# Patient Record
Sex: Male | Born: 1955 | Race: White | Hispanic: No | Marital: Married | Smoking: Never smoker
Health system: Southern US, Community
[De-identification: ages and names within clinical notes are randomized; demographics above are authoritative.]

## PROBLEM LIST (undated history)

## (undated) DIAGNOSIS — E785 Hyperlipidemia, unspecified: Secondary | ICD-10-CM

## (undated) DIAGNOSIS — Z951 Presence of aortocoronary bypass graft: Secondary | ICD-10-CM

## (undated) DIAGNOSIS — I712 Thoracic aortic aneurysm, without rupture: Secondary | ICD-10-CM

## (undated) DIAGNOSIS — I25119 Atherosclerotic heart disease of native coronary artery with unspecified angina pectoris: Secondary | ICD-10-CM

## (undated) DIAGNOSIS — I4891 Unspecified atrial fibrillation: Secondary | ICD-10-CM

## (undated) DIAGNOSIS — I9789 Other postprocedural complications and disorders of the circulatory system, not elsewhere classified: Secondary | ICD-10-CM

## (undated) HISTORY — DX: Hyperlipidemia, unspecified: E78.5

## (undated) HISTORY — DX: Presence of aortocoronary bypass graft: Z95.1

## (undated) HISTORY — DX: Unspecified atrial fibrillation: I48.91

## (undated) HISTORY — DX: Thoracic aortic aneurysm, without rupture: I71.2

## (undated) HISTORY — DX: Atherosclerotic heart disease of native coronary artery with unspecified angina pectoris: I25.119

## (undated) HISTORY — DX: Other postprocedural complications and disorders of the circulatory system, not elsewhere classified: I97.89

---

## 2012-01-17 HISTORY — PX: CORONARY ARTERY BYPASS GRAFT: SHX141

## 2012-07-16 DIAGNOSIS — I712 Thoracic aortic aneurysm, without rupture: Secondary | ICD-10-CM

## 2012-07-16 DIAGNOSIS — I7121 Aneurysm of the ascending aorta, without rupture: Secondary | ICD-10-CM

## 2012-07-16 HISTORY — DX: Aneurysm of the ascending aorta, without rupture: I71.21

## 2012-07-16 HISTORY — PX: CARDIAC CATHETERIZATION: SHX172

## 2012-07-16 HISTORY — DX: Thoracic aortic aneurysm, without rupture: I71.2

## 2012-07-16 HISTORY — PX: TRANSTHORACIC ECHOCARDIOGRAM: SHX275

## 2012-07-16 HISTORY — PX: NM MYOVIEW LTD: HXRAD82

## 2012-07-19 DIAGNOSIS — I25119 Atherosclerotic heart disease of native coronary artery with unspecified angina pectoris: Secondary | ICD-10-CM

## 2012-07-19 HISTORY — DX: Atherosclerotic heart disease of native coronary artery with unspecified angina pectoris: I25.119

## 2012-07-29 DIAGNOSIS — I2510 Atherosclerotic heart disease of native coronary artery without angina pectoris: Secondary | ICD-10-CM | POA: Insufficient documentation

## 2012-07-29 DIAGNOSIS — I25118 Atherosclerotic heart disease of native coronary artery with other forms of angina pectoris: Secondary | ICD-10-CM | POA: Insufficient documentation

## 2012-08-16 DIAGNOSIS — R002 Palpitations: Secondary | ICD-10-CM | POA: Insufficient documentation

## 2012-08-16 DIAGNOSIS — I4891 Unspecified atrial fibrillation: Secondary | ICD-10-CM

## 2012-08-16 HISTORY — DX: Unspecified atrial fibrillation: I48.91

## 2012-08-22 DIAGNOSIS — Z951 Presence of aortocoronary bypass graft: Secondary | ICD-10-CM

## 2012-08-22 HISTORY — DX: Presence of aortocoronary bypass graft: Z95.1

## 2014-11-02 ENCOUNTER — Observation Stay (HOSPITAL_COMMUNITY)
Admission: EM | Admit: 2014-11-02 | Discharge: 2014-11-04 | Disposition: A | Discharge status: Home / Self Care | Payer: BC Managed Care – PPO | Attending: Internal Medicine | Admitting: Internal Medicine

## 2014-11-02 ENCOUNTER — Encounter (HOSPITAL_COMMUNITY): Payer: Self-pay | Admitting: Emergency Medicine

## 2014-11-02 ENCOUNTER — Emergency Department (HOSPITAL_COMMUNITY): Payer: BC Managed Care – PPO

## 2014-11-02 DIAGNOSIS — Z951 Presence of aortocoronary bypass graft: Secondary | ICD-10-CM | POA: Insufficient documentation

## 2014-11-02 DIAGNOSIS — I2510 Atherosclerotic heart disease of native coronary artery without angina pectoris: Secondary | ICD-10-CM | POA: Insufficient documentation

## 2014-11-02 DIAGNOSIS — R7989 Other specified abnormal findings of blood chemistry: Secondary | ICD-10-CM | POA: Insufficient documentation

## 2014-11-02 DIAGNOSIS — K8020 Calculus of gallbladder without cholecystitis without obstruction: Secondary | ICD-10-CM | POA: Insufficient documentation

## 2014-11-02 DIAGNOSIS — I25110 Atherosclerotic heart disease of native coronary artery with unstable angina pectoris: Principal | ICD-10-CM | POA: Insufficient documentation

## 2014-11-02 DIAGNOSIS — Z87891 Personal history of nicotine dependence: Secondary | ICD-10-CM | POA: Diagnosis not present

## 2014-11-02 DIAGNOSIS — K810 Acute cholecystitis: Secondary | ICD-10-CM

## 2014-11-02 DIAGNOSIS — R945 Abnormal results of liver function studies: Secondary | ICD-10-CM

## 2014-11-02 DIAGNOSIS — Z7982 Long term (current) use of aspirin: Secondary | ICD-10-CM | POA: Diagnosis not present

## 2014-11-02 DIAGNOSIS — D696 Thrombocytopenia, unspecified: Secondary | ICD-10-CM | POA: Diagnosis not present

## 2014-11-02 DIAGNOSIS — E785 Hyperlipidemia, unspecified: Secondary | ICD-10-CM | POA: Insufficient documentation

## 2014-11-02 DIAGNOSIS — R1013 Epigastric pain: Secondary | ICD-10-CM

## 2014-11-02 DIAGNOSIS — I25118 Atherosclerotic heart disease of native coronary artery with other forms of angina pectoris: Secondary | ICD-10-CM | POA: Insufficient documentation

## 2014-11-02 DIAGNOSIS — R079 Chest pain, unspecified: Secondary | ICD-10-CM

## 2014-11-02 DIAGNOSIS — I200 Unstable angina: Secondary | ICD-10-CM | POA: Diagnosis present

## 2014-11-02 LAB — CBC
HCT: 38 % — ABNORMAL LOW (ref 39.0–52.0)
Hemoglobin: 13.4 g/dL (ref 13.0–17.0)
MCH: 32.3 pg (ref 26.0–34.0)
MCHC: 35.3 g/dL (ref 30.0–36.0)
MCV: 91.6 fL (ref 78.0–100.0)
PLATELETS: 122 10*3/uL — AB (ref 150–400)
RBC: 4.15 MIL/uL — ABNORMAL LOW (ref 4.22–5.81)
RDW: 12.7 % (ref 11.5–15.5)
WBC: 9 10*3/uL (ref 4.0–10.5)

## 2014-11-02 LAB — COMPREHENSIVE METABOLIC PANEL
ALBUMIN: 3.8 g/dL (ref 3.5–5.0)
ALK PHOS: 54 U/L (ref 38–126)
ALT: 159 U/L — ABNORMAL HIGH (ref 17–63)
ANION GAP: 6 (ref 5–15)
AST: 203 U/L — ABNORMAL HIGH (ref 15–41)
BUN: 16 mg/dL (ref 6–20)
CALCIUM: 8.8 mg/dL — AB (ref 8.9–10.3)
CHLORIDE: 106 mmol/L (ref 101–111)
CO2: 25 mmol/L (ref 22–32)
Creatinine, Ser: 1.12 mg/dL (ref 0.61–1.24)
GFR calc non Af Amer: >60 mL/min (ref >60)
Glucose, Bld: 129 mg/dL — ABNORMAL HIGH (ref 65–99)
POTASSIUM: 3.8 mmol/L (ref 3.5–5.1)
SODIUM: 137 mmol/L (ref 135–145)
Total Bilirubin: 1.4 mg/dL — ABNORMAL HIGH (ref 0.3–1.2)
Total Protein: 6.4 g/dL — ABNORMAL LOW (ref 6.5–8.1)

## 2014-11-02 LAB — I-STAT TROPONIN, ED: TROPONIN I, POC: 0 ng/mL (ref 0.00–0.08)

## 2014-11-02 LAB — LIPASE, BLOOD: Lipase: 28 U/L (ref 22–51)

## 2014-11-02 MED ORDER — FENTANYL CITRATE (PF) 100 MCG/2ML IJ SOLN
50.0000 ug | Freq: Once | INTRAMUSCULAR | Status: DC
  Filled 2014-11-02: qty 2

## 2014-11-02 NOTE — ED Notes (Signed)
Per EMS, an hour ago, patient had pain inmiddle top of stomach and lower part of chest, hx of triple bypas 2 years ago. Patient had increased respirations, pale and clammy on arrival. 20g in right ac. Pain 5/10. 324mg  of aspirin and 2 nitro tab given prior to ems arrival. 1st nitro tab helped, 2nd did not. VS with ems 134/60, p 64, 99% on 2L nasal cannula.

## 2014-11-02 NOTE — ED Provider Notes (Signed)
CSN: [id number]     Arrival date & time 11/02/14  2012 History   First MD Initiated Contact with Patient 11/02/14 2018     Chief Complaint  Patient presents with  . Chest Pain  . Shortness of Breath     (Consider location/radiation/quality/duration/timing/severity/associated sxs/prior Treatment) Patient is a 59 y.o. male presenting with chest pain and shortness of breath.  Chest Pain Pain location:  Substernal area Pain quality: sharp   Pain radiates to:  Does not radiate Pain radiates to the back: no   Pain severity:  Severe Onset quality:  Sudden Duration:  3 hours Timing:  Constant Progression:  Partially resolved Chronicity:  New Context comment:  At rest, sitting Relieved by:  Nothing Worsened by:  Nothing tried Associated symptoms: shortness of breath   Associated symptoms: no nausea and not vomiting   Shortness of Breath Associated symptoms: chest pain   Associated symptoms: no vomiting    [facility] Past Medical History  Diagnosis Date  . Hyperlipidemia    Past Surgical History  Procedure Laterality Date  . Other surgical history      triple bypass surgery   History reviewed. No pertinent family history. Social History  Substance Use Topics  . Smoking status: Former Games developermoker  . Smokeless tobacco: None  . Alcohol Use: 0.6 oz/week    1 Cans of beer per week     Comment: occasional    Review of Systems  Respiratory: Positive for shortness of breath.   Cardiovascular: Positive for chest pain.  Gastrointestinal: Negative for nausea and vomiting.  All other systems reviewed and are negative.     Allergies  Review of patient's allergies indicates no known allergies.  Home Medications   Prior to Admission medications   Medication Sig Start Date End Date Taking? Authorizing Provider  aspirin EC 81 MG tablet Take 81 mg by mouth daily.   Yes Historical Provider, MD  atorvastatin (LIPITOR) 80 MG tablet Take 80  mg by mouth daily. 10/21/14  Yes Historical Provider, MD  metoprolol tartrate (LOPRESSOR) 25 MG tablet Take 37.5 mg by mouth 2 (two) times daily. 10/04/14  Yes Historical Provider, MD  nitroGLYCERIN (NITROSTAT) 0.4 MG SL tablet PLACE 1 TABLET UNDER TONGUE EVERY 5 MINS AS NEEDED FOR CHEST PAIN CALL 911/EMS BY 3RD DOSE 10/26/14  Yes Historical Provider, MD  temazepam (RESTORIL) 15 MG capsule Take 15 mg by mouth at bedtime as needed. 08/24/14  Yes Historical Provider, MD   BP 111/60 mmHg  Pulse 76  Temp(Src) 97.5 F (36.4 C) (Oral)  Resp 11  Ht 5\' 7"  (1.702 m)  Wt 210 lb (95.255 kg)  BMI 32.88 kg/m2  SpO2 96% Physical Exam  Constitutional: He is oriented to person, place, and time. He appears well-developed and well-nourished.  HENT:  Head: Normocephalic and atraumatic.  Eyes: Conjunctivae and EOM are normal.  Neck: Normal range of motion. Neck supple.  Cardiovascular: Normal rate, regular rhythm and normal heart sounds.   Pulmonary/Chest: Effort normal and breath sounds normal. No respiratory distress.  Abdominal: He exhibits no distension. There is tenderness in the epigastric area. There is no rebound, no guarding and no CVA tenderness.  Musculoskeletal: Normal range of motion.  Neurological: He is alert and oriented to person, place, and time.  Skin: Skin is warm and dry.  Vitals reviewed.   ED Course  Procedures (including critical care time) Labs Review Labs Reviewed  CBC - Abnormal; Notable for the following:  RBC 4.15 (*)    HCT 38.0 (*)    Platelets 122 (*)    All other components within normal limits  COMPREHENSIVE METABOLIC PANEL - Abnormal; Notable for the following:    Glucose, Bld 129 (*)    Calcium 8.8 (*)    Total Protein 6.4 (*)    AST 203 (*)    ALT 159 (*)    Total Bilirubin 1.4 (*)    All other components within normal limits  LIPASE, BLOOD  I-STAT TROPOININ, ED  I-STAT TROPOININ, ED    Imaging Review Dg Chest 2 View  11/02/2014  CLINICAL DATA:   Lower chest and mid stomach pain 1 hour ago, history coronary artery disease post triple bypass 2 years ago, tachypnea, pale and clammy on arrival, former smoker, hyperlipidemia EXAM: CHEST  2 VIEW COMPARISON:  12/14/2013 FINDINGS: Minimal enlargement of cardiac silhouette post CABG. Tortuous aorta. Pulmonary vascularity normal. Lungs clear. No pulmonary infiltrate, pleural effusion or pneumothorax. Bones unremarkable. IMPRESSION: Minimal enlargement of cardiac silhouette post CABG. No acute abnormalities. Electronically Signed   By: Ulyses Southward M.D.   On: 11/02/2014 21:09   I have personally reviewed and evaluated these images and lab results as part of my medical decision-making.   EKG Interpretation   Date/Time:  Monday November 02 2014 20:19:15 EDT Ventricular Rate:  61 PR Interval:  188 QRS Duration: 88 QT Interval:  394 QTC Calculation: 397 R Axis:   31 Text Interpretation:  Sinus rhythm Low voltage, precordial leads No old  tracing to compare Confirmed by Mirian Mo [phone]) on 11/02/2014  9:14:49 PM      MDM   Final diagnoses:  Epigastric pain    59 y.o. male with pertinent PMH of CAD presents with typical chest pain concerning for unstable angina.  Does have some epigastric tenderness, but no fever or other symptoms.  Exam and wu as above.  Consulted cardiology and medicine for admission.    I have reviewed all laboratory and imaging studies if ordered as above  1. Epigastric pain         Mirian Mo, MD 11/03/14 [phone]

## 2014-11-03 ENCOUNTER — Encounter (HOSPITAL_COMMUNITY): Payer: Self-pay | Admitting: Cardiovascular Disease

## 2014-11-03 ENCOUNTER — Observation Stay (HOSPITAL_COMMUNITY): Payer: BC Managed Care – PPO

## 2014-11-03 ENCOUNTER — Encounter (HOSPITAL_COMMUNITY)
Admission: EM | Disposition: A | Discharge status: Home / Self Care | Payer: Self-pay | Source: Home / Self Care | Attending: Emergency Medicine

## 2014-11-03 DIAGNOSIS — I200 Unstable angina: Secondary | ICD-10-CM | POA: Diagnosis not present

## 2014-11-03 DIAGNOSIS — I25700 Atherosclerosis of coronary artery bypass graft(s), unspecified, with unstable angina pectoris: Secondary | ICD-10-CM | POA: Diagnosis not present

## 2014-11-03 DIAGNOSIS — I25110 Atherosclerotic heart disease of native coronary artery with unstable angina pectoris: Secondary | ICD-10-CM | POA: Diagnosis not present

## 2014-11-03 DIAGNOSIS — R7989 Other specified abnormal findings of blood chemistry: Secondary | ICD-10-CM

## 2014-11-03 DIAGNOSIS — E785 Hyperlipidemia, unspecified: Secondary | ICD-10-CM

## 2014-11-03 DIAGNOSIS — R945 Abnormal results of liver function studies: Secondary | ICD-10-CM

## 2014-11-03 DIAGNOSIS — R079 Chest pain, unspecified: Secondary | ICD-10-CM

## 2014-11-03 HISTORY — PX: CARDIAC CATHETERIZATION: SHX172

## 2014-11-03 LAB — PROTIME-INR
INR: 1.08 (ref 0.00–1.49)
Prothrombin Time: 14.2 seconds (ref 11.6–15.2)

## 2014-11-03 LAB — TROPONIN I: Troponin I: <0.03 ng/mL (ref <0.031)

## 2014-11-03 LAB — COMPREHENSIVE METABOLIC PANEL
ALK PHOS: 69 U/L (ref 38–126)
ALT: 501 U/L — ABNORMAL HIGH (ref 17–63)
ANION GAP: 9 (ref 5–15)
AST: 467 U/L — ABNORMAL HIGH (ref 15–41)
Albumin: 3.6 g/dL (ref 3.5–5.0)
BILIRUBIN TOTAL: 2.2 mg/dL — AB (ref 0.3–1.2)
BUN: 15 mg/dL (ref 6–20)
CALCIUM: 8.9 mg/dL (ref 8.9–10.3)
CO2: 25 mmol/L (ref 22–32)
Chloride: 104 mmol/L (ref 101–111)
Creatinine, Ser: 1.1 mg/dL (ref 0.61–1.24)
GLUCOSE: 110 mg/dL — AB (ref 65–99)
POTASSIUM: 3.7 mmol/L (ref 3.5–5.1)
Sodium: 138 mmol/L (ref 135–145)
TOTAL PROTEIN: 5.9 g/dL — AB (ref 6.5–8.1)

## 2014-11-03 LAB — I-STAT TROPONIN, ED: TROPONIN I, POC: 0 ng/mL (ref 0.00–0.08)

## 2014-11-03 SURGERY — LEFT HEART CATH AND CORS/GRAFTS ANGIOGRAPHY
Anesthesia: LOCAL

## 2014-11-03 MED ORDER — NITROGLYCERIN 1 MG/10 ML FOR IR/CATH LAB
INTRA_ARTERIAL | Status: AC
  Filled 2014-11-03: qty 10

## 2014-11-03 MED ORDER — IOHEXOL 350 MG/ML SOLN
INTRAVENOUS | Status: DC | PRN
  Administered 2014-11-03: 170 mL via INTRAVENOUS

## 2014-11-03 MED ORDER — ENOXAPARIN SODIUM 40 MG/0.4ML ~~LOC~~ SOLN
40.0000 mg | Freq: Every day | SUBCUTANEOUS | Status: DC
  Filled 2014-11-03: qty 0.4

## 2014-11-03 MED ORDER — SODIUM CHLORIDE 0.9 % IV SOLN: 250.0000 mL | INTRAVENOUS | Status: DC | PRN

## 2014-11-03 MED ORDER — ONDANSETRON HCL 4 MG/2ML IJ SOLN: 4.0000 mg | Freq: Four times a day (QID) | INTRAMUSCULAR | Status: DC | PRN

## 2014-11-03 MED ORDER — METOPROLOL TARTRATE 25 MG PO TABS
37.5000 mg | ORAL_TABLET | Freq: Two times a day (BID) | ORAL | Status: DC
  Administered 2014-11-03 (×2): 37.5 mg via ORAL
  Administered 2014-11-03: 25 mg via ORAL
  Administered 2014-11-04: 37.5 mg via ORAL
  Filled 2014-11-03 (×4): qty 1
  Filled 2014-11-03: qty 2
  Filled 2014-11-03 (×2): qty 1

## 2014-11-03 MED ORDER — ATORVASTATIN CALCIUM 80 MG PO TABS
80.0000 mg | ORAL_TABLET | Freq: Every day | ORAL | Status: DC
  Administered 2014-11-03: 80 mg via ORAL
  Filled 2014-11-03 (×2): qty 1

## 2014-11-03 MED ORDER — HEPARIN (PORCINE) IN NACL 2-0.9 UNIT/ML-% IJ SOLN
INTRAMUSCULAR | Status: DC | PRN
  Administered 2014-11-03: 10 mL via INTRA_ARTERIAL

## 2014-11-03 MED ORDER — ASPIRIN 81 MG PO CHEW: 81.0000 mg | CHEWABLE_TABLET | ORAL | Status: DC

## 2014-11-03 MED ORDER — ACETAMINOPHEN 325 MG PO TABS
650.0000 mg | ORAL_TABLET | ORAL | Status: DC | PRN
  Administered 2014-11-04: 650 mg via ORAL
  Filled 2014-11-03: qty 2

## 2014-11-03 MED ORDER — FENTANYL CITRATE (PF) 100 MCG/2ML IJ SOLN
INTRAMUSCULAR | Status: DC | PRN
  Administered 2014-11-03: 25 ug via INTRAVENOUS

## 2014-11-03 MED ORDER — HYDROMORPHONE HCL 1 MG/ML IJ SOLN
0.5000 mg | Freq: Once | INTRAMUSCULAR | Status: AC
  Administered 2014-11-03: 0.5 mg via INTRAVENOUS
  Filled 2014-11-03: qty 1

## 2014-11-03 MED ORDER — MIDAZOLAM HCL 2 MG/2ML IJ SOLN
INTRAMUSCULAR | Status: DC | PRN
  Administered 2014-11-03: 2 mg via INTRAVENOUS

## 2014-11-03 MED ORDER — MIDAZOLAM HCL 2 MG/2ML IJ SOLN
INTRAMUSCULAR | Status: AC
  Filled 2014-11-03: qty 4

## 2014-11-03 MED ORDER — TEMAZEPAM 15 MG PO CAPS: 15.0000 mg | ORAL_CAPSULE | Freq: Every evening | ORAL | Status: DC | PRN

## 2014-11-03 MED ORDER — CLOPIDOGREL BISULFATE 75 MG PO TABS
75.0000 mg | ORAL_TABLET | Freq: Every day | ORAL | Status: DC
  Administered 2014-11-04: 75 mg via ORAL
  Filled 2014-11-03 (×2): qty 1

## 2014-11-03 MED ORDER — CLOPIDOGREL BISULFATE 75 MG PO TABS: 300.0000 mg | ORAL_TABLET | Freq: Once | ORAL | Status: DC

## 2014-11-03 MED ORDER — ASPIRIN EC 81 MG PO TBEC
81.0000 mg | DELAYED_RELEASE_TABLET | Freq: Every day | ORAL | Status: DC
  Administered 2014-11-03 – 2014-11-04 (×2): 81 mg via ORAL
  Filled 2014-11-03 (×2): qty 1

## 2014-11-03 MED ORDER — SODIUM CHLORIDE 0.9 % IJ SOLN: 3.0000 mL | INTRAMUSCULAR | Status: DC | PRN

## 2014-11-03 MED ORDER — LIDOCAINE HCL (PF) 1 % IJ SOLN
INTRAMUSCULAR | Status: AC
  Filled 2014-11-03: qty 30

## 2014-11-03 MED ORDER — HEPARIN (PORCINE) IN NACL 2-0.9 UNIT/ML-% IJ SOLN
INTRAMUSCULAR | Status: AC
  Filled 2014-11-03: qty 1000

## 2014-11-03 MED ORDER — FENTANYL CITRATE (PF) 100 MCG/2ML IJ SOLN
INTRAMUSCULAR | Status: AC
  Filled 2014-11-03: qty 4

## 2014-11-03 MED ORDER — VERAPAMIL HCL 2.5 MG/ML IV SOLN
INTRAVENOUS | Status: AC
  Filled 2014-11-03: qty 2

## 2014-11-03 MED ORDER — ISOSORBIDE MONONITRATE ER 30 MG PO TB24
30.0000 mg | ORAL_TABLET | Freq: Every day | ORAL | Status: DC
  Administered 2014-11-03 – 2014-11-04 (×2): 30 mg via ORAL
  Filled 2014-11-03 (×2): qty 1

## 2014-11-03 MED ORDER — HEPARIN SODIUM (PORCINE) 5000 UNIT/ML IJ SOLN
5000.0000 [IU] | Freq: Three times a day (TID) | INTRAMUSCULAR | Status: DC
  Administered 2014-11-03 – 2014-11-04 (×3): 5000 [IU] via SUBCUTANEOUS
  Filled 2014-11-03 (×3): qty 1

## 2014-11-03 MED ORDER — SODIUM CHLORIDE 0.9 % IJ SOLN
3.0000 mL | Freq: Two times a day (BID) | INTRAMUSCULAR | Status: DC
  Administered 2014-11-03: 3 mL via INTRAVENOUS

## 2014-11-03 MED ORDER — SODIUM CHLORIDE 0.9 % WEIGHT BASED INFUSION: 3.0000 mL/kg/h | INTRAVENOUS | Status: AC

## 2014-11-03 MED ORDER — NITROGLYCERIN 1 MG/10 ML FOR IR/CATH LAB
INTRA_ARTERIAL | Status: DC | PRN
  Administered 2014-11-03: 16:00:00

## 2014-11-03 MED ORDER — HEPARIN SODIUM (PORCINE) 1000 UNIT/ML IJ SOLN
INTRAMUSCULAR | Status: AC
  Filled 2014-11-03: qty 1

## 2014-11-03 MED ORDER — HEPARIN SODIUM (PORCINE) 1000 UNIT/ML IJ SOLN
INTRAMUSCULAR | Status: DC | PRN
  Administered 2014-11-03: 5000 [IU] via INTRAVENOUS

## 2014-11-03 MED ORDER — SODIUM CHLORIDE 0.9 % IV SOLN
INTRAVENOUS | Status: DC
  Administered 2014-11-03: 12:00:00 via INTRAVENOUS

## 2014-11-03 MED ORDER — MORPHINE SULFATE (PF) 2 MG/ML IV SOLN: 2.0000 mg | INTRAVENOUS | Status: DC | PRN

## 2014-11-03 SURGICAL SUPPLY — 15 items
CATH INFINITI 5 FR IM (CATHETERS) ×1 IMPLANT
CATH INFINITI 5FR AL1 (CATHETERS) ×1 IMPLANT
CATH INFINITI 5FR ANG PIGTAIL (CATHETERS) ×2 IMPLANT
CATH INFINITI 5FR MULTPACK ANG (CATHETERS) ×1 IMPLANT
CATH LAUNCHER 5F RADR (CATHETERS) IMPLANT
CATH OPTITORQUE TIG 4.0 5F (CATHETERS) ×2 IMPLANT
CATHETER LAUNCHER 5F RADR (CATHETERS) ×2
DEVICE RAD COMP TR BAND LRG (VASCULAR PRODUCTS) ×2 IMPLANT
GLIDESHEATH SLEND A-KIT 6F 22G (SHEATH) ×2 IMPLANT
KIT HEART LEFT (KITS) ×2 IMPLANT
PACK CARDIAC CATHETERIZATION (CUSTOM PROCEDURE TRAY) ×2 IMPLANT
SYR MEDRAD MARK V 150ML (SYRINGE) ×2 IMPLANT
TRANSDUCER W/STOPCOCK (MISCELLANEOUS) ×2 IMPLANT
TUBING CIL FLEX 10 FLL-RA (TUBING) ×2 IMPLANT
WIRE SAFE-T 1.5MM-J .035X260CM (WIRE) ×2 IMPLANT

## 2014-11-03 NOTE — ED Notes (Signed)
Dr. Julian ReilGardner paged to review US results with patient. Planning for admission .

## 2014-11-03 NOTE — Consult Note (Signed)
Patient ID: Edgar BastRobert D Gersten MRN: [id number] DOB/AGE: [birth year] 59 y.o.  Admit date: 11/02/2014 Primary Cardiologist: [facility] Reason for Consultation: Chest pain  HPI: 59 yo male with history of CAD [facility]/p [facility] who is admitted with chest pain. He describes severe burning in his left chest with moderate exertion for last 2 weeks. Severe substernal chest pain last night with associated dyspnea, diaphoresis. This resolved with NTG. Admitted to the Hospitalist service last night. Troponin negative x 1. EKG without ischemia changes. Chest x-ray clear. No chest pain at rest this am. No SOB this am. No recent weight change, LE edema. He has never been a smoker and does not have DM or family history of CAD. He has followed with Dr. [facility]line [facility]ode in [facility] since his bypass.   No chest pain or SOB this am.    Past Medical History  Diagnosis Date  . Hyperlipidemia   . CAD (coronary artery disease)     [facility]    Family History  Problem Relation Age of Onset  . Stroke Father     Social History   Social History  . Marital Status: Married    Spouse Name: [name]  . Number of Children: [name]  . Years of Education: [name]   Occupational History  . Not on file.   Social History Main Topics  . Smoking status: Never Smoker   . Smokeless tobacco: Current User  . Alcohol Use: 0.6 oz/week    1 Cans of beer per week     Comment: occasional  . Drug Use: No  . Sexual Activity: Not on file   Other Topics Concern  . Not on file   Social History Narrative    Past Surgical History  Procedure Laterality Date  . Coronary artery bypass graft  2014    3V    No Known Allergies  Prior to Admission medications   Medication Sig Start Date End Date Taking? Authorizing Provider  aspirin EC 81 MG tablet Take 81 mg by mouth daily.   Yes Historical Provider, MD  atorvastatin (LIPITOR) 80 MG tablet Take 80 mg by mouth  daily. 10/21/14  Yes Historical Provider, MD  metoprolol tartrate (LOPRESSOR) 25 MG tablet Take 37.5 mg by mouth 2 (two) times daily. 10/04/14  Yes Historical Provider, MD  nitroGLYCERIN (NITROSTAT) 0.4 MG SL tablet PLACE 1 TABLET UNDER TONGUE EVERY 5 MINS AS NEEDED FOR CHEST PAIN CALL 911/EMS BY 3RD DOSE 10/26/14  Yes Historical Provider, MD  temazepam (RESTORIL) 15 MG capsule Take 15 mg by mouth at bedtime as needed. 08/24/14  Yes Historical Provider, MD   Hospital Medications:  . aspirin EC  81 mg Oral Daily  . atorvastatin  80 mg Oral Daily  . enoxaparin (LOVENOX) injection  40 mg Subcutaneous Daily  . fentaNYL (SUBLIMAZE) injection  50 mcg Intravenous Once  . metoprolol tartrate  37.5 mg Oral BID    Review of systems complete and found to be negative unless listed above   Physical Exam: Blood pressure 118/68, pulse 64, temperature 98.1 F (36.7 C), temperature source Oral, resp. rate 18, height 5\' 7"  (1.702 m), weight 210 lb (95.255 kg), SpO2 99 %.    General: Well developed, well nourished, NAD  HEENT: OP clear, mucus membranes moist  SKIN: warm, dry. No rashes.  Neuro: No focal deficits  Musculoskeletal: Muscle strength 5/5 all ext  Psychiatric: Mood and  affect normal  Neck: No JVD, no carotid bruits, no thyromegaly, no lymphadenopathy.  Lungs:Clear bilaterally, no wheezes, rhonci, crackles  Cardiovascular: Regular rate and rhythm. No murmurs, gallops or rubs.  Abdomen:Soft. Bowel sounds present. Non-tender.  Extremities: No lower extremity edema. Pulses are 2 + in the bilateral DP/PT.   Labs:   Lab Results  Component Value Date   WBC 9.0 11/02/2014   HGB 13.4 11/02/2014   HCT 38.0* 11/02/2014   MCV 91.6 11/02/2014   PLT 122* 11/02/2014     Recent Labs Lab 11/03/14 0545  NA 138  K 3.7  CL 104  CO2 25  BUN 15  CREATININE 1.10  CALCIUM 8.9  PROT 5.9*  BILITOT 2.2*  ALKPHOS 69  ALT 501*  AST 467*  GLUCOSE 110*   Lab Results  Component Value Date    TROPONINI <0.03 11/03/2014       Chest x-ray: 11/02/14: FINDINGS: Minimal enlargement of cardiac silhouette post CABG. Tortuous aorta. Pulmonary vascularity normal. Lungs clear. No pulmonary infiltrate, pleural effusion or pneumothorax. Bones unremarkable.  EKG: sinus, rate 61 bpm.   ASSESSMENT AND PLAN:   1. CAD s/p CABG/Unstable angina: Pt with known CAD s/p CABG in 2014 at [facility]. He has symptoms c/w unstable angina. Will plan cardiac cath with possible PCI later today. Pt is NPO. Risks and benefits of cath reviewed with pt. He agrees to proceed. Labs reviewed and ok for cath.   2. Elevated LFTs: Would hold statin. Workup per primary team.    Signed: Christopher McAlhany, MD 11/03/2014, 10:55 AM  

## 2014-11-03 NOTE — ED Notes (Signed)
Attempted Report x1.   

## 2014-11-03 NOTE — ED Notes (Signed)
Spoke with Dr. Julian ReilGardner, hospitalist, inquired about lovenox if patient is a  Potential surgical candidate. He allows to administer as scheduled. Ppharmacy states it will be started later today as it's prophylaxis.

## 2014-11-03 NOTE — Interval H&P Note (Signed)
History and Physical Interval Note:  11/03/2014 3:14 PM  Dennis Bastobert D Grist  has presented today for surgery, with the diagnosis of c/p - Unstable Angina.   The various methods of treatment have been discussed with the patient and family. After consideration of risks, benefits and other options for treatment, the patient has consented to  Procedure(s): Left Heart Cath and Cors/Grafts Angiography ([name]) as a surgical intervention .   The patient's history has been reviewed, patient examined, no change in status, stable for surgery.  I have reviewed the patient's chart and labs.  Questions were answered to the patient's satisfaction.    Cath Lab Visit (complete for each Cath Lab visit)  Clinical Evaluation Leading to the Procedure:   ACS: Yes.    Non-ACS:    Anginal Classification: CCS IV  Anti-ischemic medical therapy: Minimal Therapy (1 class of medications)  Non-Invasive Test Results: No non-invasive testing performed  Prior CABG: Previous CABG  TIMI SCORE  Patient Information:  TIMI Score is 3  UA/NSTEMI and intermediate-risk features (e.g., TIMI score 3?4) for short-term risk of death or nonfatal MI  Revascularization of the presumed culprit artery   A (9)  Indication: 10; Score: 9  Latrell Reitan W

## 2014-11-03 NOTE — ED Notes (Signed)
Patient placed in hospital bed, informed him that breakfast has been ordered.

## 2014-11-03 NOTE — ED Notes (Signed)
Placed breakfast order for heart healthy diet, spoke to [place].

## 2014-11-03 NOTE — H&P (View-Only) (Signed)
Patient ID: Edgar Brooks MRN: [id number] DOB/AGE: [birth year] 59 y.o.  Admit date: 11/02/2014 Primary Cardiologist: [facility]) Reason for Consultation: Chest pain  HPI: 59 yo male with history of CAD s/p [facility] who is admitted with chest pain. He describes severe burning in his left chest with moderate exertion for last 2 weeks. Severe substernal chest pain last night with associated dyspnea, diaphoresis. This resolved with NTG. Admitted to the Hospitalist service last night. Troponin negative x 1. EKG without ischemia changes. Chest x-ray clear. No chest pain at rest this am. No SOB this am. No recent weight change, LE edema. He has never been a smoker and does not have DM or family history of CAD. He has followed with Dr. [facility]ode [facility] since his bypass.   No chest pain or SOB this am.    Past Medical History  Diagnosis Date  . Hyperlipidemia   . CAD (coronary artery disease)     [facility]    Family History  Problem Relation Age of Onset  . Stroke Father     Social History   Social History  . Marital Status: Married    Spouse Name: [name]  . Number of Children: [name]  . Years of Education: [name]   Occupational History  . Not on file.   Social History Main Topics  . Smoking status: Never Smoker   . Smokeless tobacco: Current User  . Alcohol Use: 0.6 oz/week    1 Cans of beer per week     Comment: occasional  . Drug Use: No  . Sexual Activity: Not on file   Other Topics Concern  . Not on file   Social History Narrative    Past Surgical History  Procedure Laterality Date  . Coronary artery bypass graft  2014    3V    No Known Allergies  Prior to Admission medications   Medication Sig Start Date End Date Taking? Authorizing Provider  aspirin EC 81 MG tablet Take 81 mg by mouth daily.   Yes Historical Provider, MD  atorvastatin (LIPITOR) 80 MG tablet Take 80 mg by mouth  daily. 10/21/14  Yes Historical Provider, MD  metoprolol tartrate (LOPRESSOR) 25 MG tablet Take 37.5 mg by mouth 2 (two) times daily. 10/04/14  Yes Historical Provider, MD  nitroGLYCERIN (NITROSTAT) 0.4 MG SL tablet PLACE 1 TABLET UNDER TONGUE EVERY 5 MINS AS NEEDED FOR CHEST PAIN CALL 911/EMS BY 3RD DOSE 10/26/14  Yes Historical Provider, MD  temazepam (RESTORIL) 15 MG capsule Take 15 mg by mouth at bedtime as needed. 08/24/14  Yes Historical Provider, MD   Hospital Medications:  . aspirin EC  81 mg Oral Daily  . atorvastatin  80 mg Oral Daily  . enoxaparin (LOVENOX) injection  40 mg Subcutaneous Daily  . fentaNYL (SUBLIMAZE) injection  50 mcg Intravenous Once  . metoprolol tartrate  37.5 mg Oral BID    Review of systems complete and found to be negative unless listed above   Physical Exam: Blood pressure 118/68, pulse 64, temperature 98.1 F (36.7 C), temperature source Oral, resp. rate 18, height 5\' 7"  (1.702 m), weight 210 lb (95.255 kg), SpO2 99 %.    General: Well developed, well nourished, NAD  HEENT: OP clear, mucus membranes moist  SKIN: warm, dry. No rashes.  Neuro: No focal deficits  Musculoskeletal: Muscle strength 5/5 all ext  Psychiatric: Mood and  affect normal  Neck: No JVD, no carotid bruits, no thyromegaly, no lymphadenopathy.  Lungs:Clear bilaterally, no wheezes, rhonci, crackles  Cardiovascular: Regular rate and rhythm. No murmurs, gallops or rubs.  Abdomen:Soft. Bowel sounds present. Non-tender.  Extremities: No lower extremity edema. Pulses are 2 + in the bilateral DP/PT.   Labs:   Lab Results  Component Value Date   WBC 9.0 11/02/2014   HGB 13.4 11/02/2014   HCT 38.0* 11/02/2014   MCV 91.6 11/02/2014   PLT 122* 11/02/2014     Recent Labs Lab 11/03/14 0545  NA 138  K 3.7  CL 104  CO2 25  BUN 15  CREATININE 1.10  CALCIUM 8.9  PROT 5.9*  BILITOT 2.2*  ALKPHOS 69  ALT 501*  AST 467*  GLUCOSE 110*   Lab Results  Component Value Date    TROPONINI <0.03 11/03/2014       Chest x-ray: 11/02/14: FINDINGS: Minimal enlargement of cardiac silhouette post CABG. Tortuous aorta. Pulmonary vascularity normal. Lungs clear. No pulmonary infiltrate, pleural effusion or pneumothorax. Bones unremarkable.  EKG: sinus, rate 61 bpm.   ASSESSMENT AND PLAN:   1. CAD s/p CABG/Unstable angina: Pt with known CAD s/p CABG in 2014 at [facility]. He has symptoms c/w unstable angina. Will plan cardiac cath with possible PCI later today. Pt is NPO. Risks and benefits of cath reviewed with pt. He agrees to proceed. Labs reviewed and ok for cath.   2. Elevated LFTs: Would hold statin. Workup per primary team.    Signed: Verne Carrow, MD 11/03/2014, 10:55 AM

## 2014-11-03 NOTE — Consult Note (Signed)
Edgar Brooks 1956-01-05  [id number].   Primary Care MD: [facility] Requesting MD: Dr. 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  He has been doing well since this time.  He denies SOB or CP with physical activity.  Yesterday around 6:30pm he develop knife like substernal chest pain.  He got pale and diaphoretic.  EMS was called and he was brought to the [facility] for evaluation.  He denies N/V/D.  He denies any abdominal pain.  His EKG did not show any ischemic changes.  His troponins were negative.  He did have an abdominal US that revealed some gallstones, sludge, and minimal wall thickness c/w possible acute vs chronic cholecystitis or liver disease.  His LFTs were also elevated.  We have been asked to see him for evaluation.  ROS  : Please see HPI, otherwise negative currently, except dark urine  Family History  Problem Relation Age of Onset  . Stroke Father     Past Medical History  Diagnosis Date  . Hyperlipidemia   . CAD (coronary artery disease)     s/p 3V CABG at [facility] 2014    Past Surgical History  Procedure Laterality Date  . Coronary artery bypass graft  2014    3V    Social History:  reports that he has never smoked. He uses smokeless tobacco. He reports that he drinks about 1.2 oz of alcohol per week. He reports that he does not use illicit drugs.  Allergies: No Known Allergies  Medications Prior to Admission  Medication Sig Dispense Refill  . aspirin EC 81 MG tablet Take 81 mg by mouth daily.    [name] atorvastatin (LIPITOR) 80 MG tablet Take 80 mg by mouth daily.  5  . metoprolol tartrate (LOPRESSOR) 25 MG tablet Take 37.5 mg by mouth 2 (two) times daily.  11  . nitroGLYCERIN (NITROSTAT) 0.4 MG SL tablet PLACE 1 TABLET UNDER TONGUE EVERY 5 MINS AS NEEDED FOR CHEST PAIN CALL 911/EMS BY 3RD DOSE  1  . temazepam (RESTORIL)  15 MG capsule Take 15 mg by mouth at bedtime as needed.  5    Blood pressure 118/68, pulse 64, temperature 98.1 F (36.7 C), temperature source Oral, resp. rate 18, height $RemoveBe'5\' 7"'hJldGVuIl$  (1.702 m), weight 95.255 kg (210 lb), SpO2 99 %. Physical Exam: General: pleasant, WD, WN white male who is laying in bed in NAD HEENT: head is normocephalic, atraumatic.  Sclera are noninjected.  PERRL.  Ears and nose without any masses or lesions.  Mouth is pink and moist Heart: regular, rate, and rhythm.  Normal s1,s2. No obvious murmurs, gallops, or rubs noted.  Palpable radial and pedal pulses bilaterally Lungs: CTAB, no wheezes, rhonchi, or rales noted.  Respiratory effort nonlabored Abd: soft, NT, ND, +BS, no masses, hernias, or organomegaly MS: all 4 extremities are symmetrical with no cyanosis, clubbing, or edema. Skin: warm and dry with no masses, lesions, or rashes Psych: A&Ox3 with an appropriate affect.    Results for orders placed or performed during the hospital encounter of 11/02/14 (from the past 48 hour(s))  CBC     Status: Abnormal   Collection Time: 11/02/14  9:37 PM  Result Value Ref Range   WBC 9.0 4.0 - 10.5 K/uL   RBC 4.15 (L) 4.22 - 5.81 MIL/uL   Hemoglobin 13.4 13.0 - 17.0  g/dL   HCT 38.0 (L) 39.0 - 52.0 %   MCV 91.6 78.0 - 100.0 fL   MCH 32.3 26.0 - 34.0 pg   MCHC 35.3 30.0 - 36.0 g/dL   RDW 12.7 11.5 - 15.5 %   Platelets 122 (L) 150 - 400 K/uL  Comprehensive metabolic panel     Status: Abnormal   Collection Time: 11/02/14  9:37 PM  Result Value Ref Range   Sodium 137 135 - 145 mmol/L   Potassium 3.8 3.5 - 5.1 mmol/L   Chloride 106 101 - 111 mmol/L   CO2 25 22 - 32 mmol/L   Glucose, Bld 129 (H) 65 - 99 mg/dL   BUN 16 6 - 20 mg/dL   Creatinine, Ser 1.12 0.61 - 1.24 mg/dL   Calcium 8.8 (L) 8.9 - 10.3 mg/dL   Total Protein 6.4 (L) 6.5 - 8.1 g/dL   Albumin 3.8 3.5 - 5.0 g/dL   AST 203 (H) 15 - 41 U/L   ALT 159 (H) 17 - 63 U/L   Alkaline Phosphatase 54 38 - 126 U/L   Total  Bilirubin 1.4 (H) 0.3 - 1.2 mg/dL   GFR calc non Af Amer >60 >60 mL/min   GFR calc Af Amer >60 >60 mL/min    Comment: (NOTE) The eGFR has been calculated using the CKD EPI equation. This calculation has not been validated in all clinical situations. eGFR's persistently <60 mL/min signify possible Chronic Kidney Disease.    Anion gap 6 5 - 15  Lipase, blood     Status: None   Collection Time: 11/02/14  9:37 PM  Result Value Ref Range   Lipase 28 22 - 51 U/L  I-stat troponin, ED     Status: None   Collection Time: 11/02/14  9:41 PM  Result Value Ref Range   Troponin i, poc 0.00 0.00 - 0.08 ng/mL   Comment 3            Comment: Due to the release kinetics of cTnI, a negative result within the first hours of the onset of symptoms does not rule out myocardial infarction with certainty. If myocardial infarction is still suspected, repeat the test at appropriate intervals.   I-stat troponin, ED     Status: None   Collection Time: 11/03/14 12:44 AM  Result Value Ref Range   Troponin i, poc 0.00 0.00 - 0.08 ng/mL   Comment 3            Comment: Due to the release kinetics of cTnI, a negative result within the first hours of the onset of symptoms does not rule out myocardial infarction with certainty. If myocardial infarction is still suspected, repeat the test at appropriate intervals.   Troponin I-serum (0, 3, 6 hours)     Status: None   Collection Time: 11/03/14  3:01 AM  Result Value Ref Range   Troponin I <0.03 <0.031 ng/mL    Comment:        NO INDICATION OF MYOCARDIAL INJURY.   Troponin I-serum (0, 3, 6 hours)     Status: None   Collection Time: 11/03/14  5:45 AM  Result Value Ref Range   Troponin I <0.03 <0.031 ng/mL    Comment:        NO INDICATION OF MYOCARDIAL INJURY.   Comprehensive metabolic panel     Status: Abnormal   Collection Time: 11/03/14  5:45 AM  Result Value Ref Range   Sodium 138 135 - 145 mmol/L  Potassium 3.7 3.5 - 5.1 mmol/L   Chloride 104  101 - 111 mmol/L   CO2 25 22 - 32 mmol/L   Glucose, Bld 110 (H) 65 - 99 mg/dL   BUN 15 6 - 20 mg/dL   Creatinine, Ser 1.10 0.61 - 1.24 mg/dL   Calcium 8.9 8.9 - 10.3 mg/dL   Total Protein 5.9 (L) 6.5 - 8.1 g/dL   Albumin 3.6 3.5 - 5.0 g/dL   AST 467 (H) 15 - 41 U/L   ALT 501 (H) 17 - 63 U/L   Alkaline Phosphatase 69 38 - 126 U/L   Total Bilirubin 2.2 (H) 0.3 - 1.2 mg/dL   GFR calc non Af Amer >60 >60 mL/min   GFR calc Af Amer >60 >60 mL/min    Comment: (NOTE) The eGFR has been calculated using the CKD EPI equation. This calculation has not been validated in all clinical situations. eGFR's persistently <60 mL/min signify possible Chronic Kidney Disease.    Anion gap 9 5 - 15  Troponin I-serum (0, 3, 6 hours)     Status: None   Collection Time: 11/03/14  8:29 AM  Result Value Ref Range   Troponin I <0.03 <0.031 ng/mL    Comment:        NO INDICATION OF MYOCARDIAL INJURY.    Dg Chest 2 View  11/02/2014  CLINICAL DATA:  Lower chest and mid stomach pain 1 hour ago, history coronary artery disease post triple bypass 2 years ago, tachypnea, pale and clammy on arrival, former smoker, hyperlipidemia EXAM: CHEST  2 VIEW COMPARISON:  12/14/2013 FINDINGS: Minimal enlargement of cardiac silhouette post CABG. Tortuous aorta. Pulmonary vascularity normal. Lungs clear. No pulmonary infiltrate, pleural effusion or pneumothorax. Bones unremarkable. IMPRESSION: Minimal enlargement of cardiac silhouette post CABG. No acute abnormalities. Electronically Signed   By: Lavonia Dana M.D.   On: 11/02/2014 21:09   US Abdomen Limited Ruq  11/03/2014  CLINICAL DATA:  Sudden onset of epigastric abdominal pain tonight. Elevated LFTs. EXAM: US ABDOMEN LIMITED - RIGHT UPPER QUADRANT COMPARISON:  None. FINDINGS: Gallbladder: Physiologically distended containing shadowing stones and sludge. There is gallbladder wall thickening measuring up to 6 mm. No sonographic Murphy sign noted. Common bile duct: Diameter: 3.1  mm, normal Liver: No focal lesion identified. Diffusely heterogeneous in parenchymal echogenicity, however no discrete lesion. Normal directional blood flow in the main portal vein. IMPRESSION: 1. Gallstones and sludge in the gallbladder with mild diffuse gallbladder wall thickening. Sonographic Percell Miller sign is negative. Findings may reflect cholecystitis, acute or chronic, or can be seen in the setting of chronic liver disease. No biliary dilatation. 2. Heterogeneous hepatic parenchyma. No discrete lesion seen sonographically. Electronically Signed   By: Jeb Levering M.D.   On: 11/03/2014 02:30       Assessment/Plan 1. Chest pain, unstable angina -per cardiology.  For cardiac cath today +/- PCI if needed 2. Gallstones, elevated LFTs -the patient currently has no abdominal pain, but his LFTs are continuing to rise.  Follow CMET. -he has a history of heavy ETOH abuse, but has cut back to minimal use now.  It is unlikely that liver disease has caused this acute elevation of his liver function tests, but he may have some liver disease that is causing his gallbladder to have this abnormal appearance on his [place].  It is also possible that he may have biliary disease.  We will obtain a HIDA scan tomorrow after his cardiac cath today to further evaluate his gallbladder to truly  determine if he has cholecystitis or not.  If his HIDA is negative then he may require a work up for hepatitis, liver disease etc. -you can see this [place] appearance as well with liver congestion from Right heart failure, but that patient does not appear overloaded and has no edema, so I think this is currently doubtful.  ECHO is pending. -will follow and await HIDA results and cardiac cath results and make further recs in the am.  Lorenda Grecco E 11/03/2014, 11:49 AM Pager: [phone]

## 2014-11-03 NOTE — ED Notes (Signed)
Patient eating breakfast. °

## 2014-11-03 NOTE — ED Notes (Signed)
Called Dr. Littie DeedsGentry to report pain to request medication, md allows 0.5mg  of dilaudid.

## 2014-11-03 NOTE — H&P (Signed)
[facility] History and Physical  Edgar BastRobert D Tarpley JXB:[id number] DOB: 09/23/1955 DOA: 11/02/2014  Referring physician: EDP PCP: No primary care provider on file.   Chief Complaint: Chest pain   HPI: Edgar BastRobert D Brooks is a 59 y.o. male with h/o CAD s/p CABG in 2014 or so.  Patient presents to the ED with c/o substernal chest pain located in the center of his chest.  Chest pain is better at rest but is worse "when ever my heart rate gets up".  This has been ongoing since the 5th of this month.  This episode today was associated with severe chest pain, SOB, diaphoresis.  First NTG helped his pain, second NTG didn't.  Review of Systems: Systems reviewed.  As above, otherwise negative  Past Medical History  Diagnosis Date  . Hyperlipidemia    Past Surgical History  Procedure Laterality Date  . Other surgical history      triple bypass surgery   Social History:  reports that he has quit smoking. He does not have any smokeless tobacco history on file. He reports that he drinks about 0.6 oz of alcohol per week. He reports that he does not use illicit drugs.  No Known Allergies  History reviewed. No pertinent family history.   Prior to Admission medications   Medication Sig Start Date End Date Taking? Authorizing Provider  aspirin EC 81 MG tablet Take 81 mg by mouth daily.   Yes Historical Provider, MD  atorvastatin (LIPITOR) 80 MG tablet Take 80 mg by mouth daily. 10/21/14  Yes Historical Provider, MD  metoprolol tartrate (LOPRESSOR) 25 MG tablet Take 37.5 mg by mouth 2 (two) times daily. 10/04/14  Yes Historical Provider, MD  nitroGLYCERIN (NITROSTAT) 0.4 MG SL tablet PLACE 1 TABLET UNDER TONGUE EVERY 5 MINS AS NEEDED FOR CHEST PAIN CALL 911/EMS BY 3RD DOSE 10/26/14  Yes Historical Provider, MD  temazepam (RESTORIL) 15 MG capsule Take 15 mg by mouth at bedtime as needed. 08/24/14  Yes Historical Provider, MD   Physical Exam: Filed Vitals:   11/03/14 0145  BP: 90/62  Pulse: 83   Temp:   Resp: 15    BP 90/62 mmHg  Pulse 83  Temp(Src) 97.5 F (36.4 C) (Oral)  Resp 15  Ht 5\' 7"  (1.702 m)  Wt 95.255 kg (210 lb)  BMI 32.88 kg/m2  SpO2 97%  General Appearance:    Alert, oriented, no distress, appears stated age  Head:    Normocephalic, atraumatic  Eyes:    PERRL, EOMI, sclera non-icteric        Nose:   Nares without drainage or epistaxis. Mucosa, turbinates normal  Throat:   Moist mucous membranes. Oropharynx without erythema or exudate.  Neck:   Supple. No carotid bruits.  No thyromegaly.  No lymphadenopathy.   Back:     No CVA tenderness, no spinal tenderness  Lungs:     Clear to auscultation bilaterally, without wheezes, rhonchi or rales  Chest wall:    No tenderness to palpitation  Heart:    Regular rate and rhythm without murmurs, gallops, rubs  Abdomen:     Soft, non-tender, nondistended, normal bowel sounds, no organomegaly  Genitalia:    deferred  Rectal:    deferred  Extremities:   No clubbing, cyanosis or edema.  Pulses:   2+ and symmetric all extremities  Skin:   Skin color, texture, turgor normal, no rashes or lesions  Lymph nodes:   Cervical, supraclavicular, and axillary nodes normal  Neurologic:   CNII-XII  intact. Normal strength, sensation and reflexes      throughout    Labs on Admission:  Basic Metabolic Panel:  Recent Labs Lab 11/02/14 2137  NA 137  K 3.8  CL 106  CO2 25  GLUCOSE 129*  BUN 16  CREATININE 1.12  CALCIUM 8.8*   Liver Function Tests:  Recent Labs Lab 11/02/14 2137  AST 203*  ALT 159*  ALKPHOS 54  BILITOT 1.4*  PROT 6.4*  ALBUMIN 3.8    Recent Labs Lab 11/02/14 2137  LIPASE 28   No results for input(s): AMMONIA in the last 168 hours. CBC:  Recent Labs Lab 11/02/14 2137  WBC 9.0  HGB 13.4  HCT 38.0*  MCV 91.6  PLT 122*   Cardiac Enzymes: No results for input(s): CKTOTAL, CKMB, CKMBINDEX, TROPONINI in the last 168 hours.  BNP (last 3 results) No results for input(s): PROBNP in the  last 8760 hours. CBG: No results for input(s): GLUCAP in the last 168 hours.  Radiological Exams on Admission: Dg Chest 2 View  11/02/2014  CLINICAL DATA:  Lower chest and mid stomach pain 1 hour ago, history coronary artery disease post triple bypass 2 years ago, tachypnea, pale and clammy on arrival, former smoker, hyperlipidemia EXAM: CHEST  2 VIEW COMPARISON:  12/14/2013 FINDINGS: Minimal enlargement of cardiac silhouette post CABG. Tortuous aorta. Pulmonary vascularity normal. Lungs clear. No pulmonary infiltrate, pleural effusion or pneumothorax. Bones unremarkable. IMPRESSION: Minimal enlargement of cardiac silhouette post CABG. No acute abnormalities. Electronically Signed   By: Ulyses Southward M.D.   On: 11/02/2014 21:09    EKG: Independently reviewed.  Assessment/Plan Principal Problem:   Unstable angina (HCC)   1. Unstable angina - 1. HEART score of 6 2. Chest pain obs pathway 3. NPO after midnight 4. Serial trops 5. Cards to eval in AM for stress test. 2. Elevated LFTs - checking RUQ ultrasound, but has no tenderness on my exam, I fear that the mildly elevated LFTs may just be a red-herring in this patient. 1. Recheck CMP in AM.    Code Status: Full  Family Communication: Family at bedside Disposition Plan: Admit to obs   Time spent: 50 min  GARDNER, JARED M. [facility] Pager [phone]  If 7AM-7PM, please contact the day team taking care of the patient [email or web address] Password [email or web address] 11/03/2014, 2:32 AM

## 2014-11-03 NOTE — Progress Notes (Addendum)
Patient Demographics  Edgar CheRobert Brooks, is a 59 y.o. male, DOB - 1955-08-15, ZOX:[id number]  Admit date - 11/02/2014   Admitting Physician Starleen Armsawood S Jullianna Gabor, MD  Outpatient Primary MD for the patient is No primary care provider on file.  LOS -    Chief Complaint  Patient presents with  . Chest Pain  . Shortness of Breath         Subjective:   Edgar Brooks today has, No headache,  No abdominal pain - No Nausea,  SOB, no recurrence of chest pain since admission.  Assessment & Plan    Principal Problem:   Unstable angina (HCC)  Chest pain - Patient with history of coronary artery disease status post CABG - Cartilage consult appreciated, symptoms consistent with unstable angina, plan is for cardiac cath with possible PCI fever today.  Elevated LFTs - Repeat LFTs this a.m. her worsening, but upper quadrant ultrasound today significant for sludge, gallstones mild diffuse gallbladder wall thickening. - Continue to hold statin. - Requested surgical consult for possible cholelithiasis  [facility] of coronary artery disease [facility] - Continue with aspirin, beta blockers, holding statins given elevated LFTs  Hyperlipidemia - Continue to hold statin due to elevated LFTs  Code Status: Full  Family Communication: None at bedside  Disposition Plan: Pending further workup   Procedures  None   Consults   Cardiology Surgery  Medications  Scheduled Meds: . aspirin  81 mg Oral Pre-Cath  . aspirin EC  81 mg Oral Daily  . fentaNYL (SUBLIMAZE) injection  50 mcg Intravenous Once  . metoprolol tartrate  37.5 mg Oral BID  . sodium chloride  3 mL Intravenous Q12H   Continuous Infusions: . sodium chloride     PRN Meds:.sodium chloride, acetaminophen, ondansetron (ZOFRAN) IV, sodium chloride, temazepam  DVT Prophylaxis   [place] heparin  Lab Results  Component Value Date   PLT  122* 11/02/2014    Antibiotics    Anti-infectives    None          Objective:   Filed Vitals:   11/03/14 0600 11/03/14 0700 11/03/14 0800 11/03/14 0820  BP: 109/81 110/70 101/59 118/68  Pulse: 58 55 51 64  Temp:    98.1 F (36.7 C)  TempSrc:    Oral  Resp: 14 12 16 18   Height:    5\' 7"  (1.702 m)  Weight:    95.255 kg (210 lb)  SpO2: 97% 99% 97% 99%    Wt Readings from Last 3 Encounters:  11/03/14 95.255 kg (210 lb)    No intake or output data in the 24 hours ending 11/03/14 1129   Physical Exam  Awake Alert, Oriented X 3, No new F.N deficits, Normal affect .AT,PERRAL Supple Neck,No JVD, No cervical lymphadenopathy appriciated.  Symmetrical Chest wall movement, Good air movement bilaterally, CTAB RRR,No Gallops,Rubs or new Murmurs, No Parasternal Heave +ve B.Sounds, Abd Soft, No tenderness, No organomegaly appriciated, No rebound - guarding or rigidity. No Cyanosis, Clubbing or edema, No new Rash or bruise     Data Review   Micro Results No results found for this or any previous visit (from the past 240 hour(s)).  Radiology Reports Dg Chest 2 View  11/02/2014  CLINICAL DATA:  Lower chest and mid stomach pain 1 hour ago, history coronary artery disease post triple bypass 2 years ago, tachypnea, pale and clammy on arrival, former smoker, hyperlipidemia EXAM: CHEST  2 VIEW COMPARISON:  12/14/2013 FINDINGS: Minimal enlargement of cardiac silhouette post CABG. Tortuous aorta. Pulmonary vascularity normal. Lungs clear. No pulmonary infiltrate, pleural effusion or pneumothorax. Bones unremarkable. IMPRESSION: Minimal enlargement of cardiac silhouette post CABG. No acute abnormalities. Electronically Signed   By: Ulyses Southward M.D.   On: 11/02/2014 21:09   US Abdomen Limited Ruq  11/03/2014  CLINICAL DATA:  Sudden onset of epigastric abdominal pain tonight. Elevated LFTs. EXAM: US ABDOMEN LIMITED - RIGHT UPPER QUADRANT COMPARISON:  None. FINDINGS: Gallbladder:  Physiologically distended containing shadowing stones and sludge. There is gallbladder wall thickening measuring up to 6 mm. No sonographic Murphy sign noted. Common bile duct: Diameter: 3.1 mm, normal Liver: No focal lesion identified. Diffusely heterogeneous in parenchymal echogenicity, however no discrete lesion. Normal directional blood flow in the main portal vein. IMPRESSION: 1. Gallstones and sludge in the gallbladder with mild diffuse gallbladder wall thickening. Sonographic Eulah Pont sign is negative. Findings may reflect cholecystitis, acute or chronic, or can be seen in the setting of chronic liver disease. No biliary dilatation. 2. Heterogeneous hepatic parenchyma. No discrete lesion seen sonographically. Electronically Signed   By: Rubye Oaks M.D.   On: 11/03/2014 02:30     CBC  Recent Labs Lab 11/02/14 2137  WBC 9.0  HGB 13.4  HCT 38.0*  PLT 122*  MCV 91.6  MCH 32.3  MCHC 35.3  RDW 12.7    Chemistries   Recent Labs Lab 11/02/14 2137 11/03/14 0545  NA 137 138  K 3.8 3.7  CL 106 104  CO2 25 25  GLUCOSE 129* 110*  BUN 16 15  CREATININE 1.12 1.10  CALCIUM 8.8* 8.9  AST 203* 467*  ALT 159* 501*  ALKPHOS 54 69  BILITOT 1.4* 2.2*   ------------------------------------------------------------------------------------------------------------------ estimated creatinine clearance is 82.5 mL/min (by C-G formula based on Cr of 1.1). ------------------------------------------------------------------------------------------------------------------ No results for input(s): HGBA1C in the last 72 hours. ------------------------------------------------------------------------------------------------------------------ No results for input(s): CHOL, HDL, LDLCALC, TRIG, CHOLHDL, LDLDIRECT in the last 72 hours. ------------------------------------------------------------------------------------------------------------------ No results for input(s): TSH, T4TOTAL, T3FREE, THYROIDAB  in the last 72 hours.  Invalid input(s): FREET3 ------------------------------------------------------------------------------------------------------------------ No results for input(s): VITAMINB12, FOLATE, FERRITIN, TIBC, IRON, RETICCTPCT in the last 72 hours.  Coagulation profile No results for input(s): INR, PROTIME in the last 168 hours.  No results for input(s): DDIMER in the last 72 hours.  Cardiac Enzymes  Recent Labs Lab 11/03/14 0301 11/03/14 0545 11/03/14 0829  TROPONINI <0.03 <0.03 <0.03   ------------------------------------------------------------------------------------------------------------------ Invalid input(s): POCBNP     Time Spent in minutes   no charge   [facility], Allyah Heather M.D on 11/03/2014 at 11:29 AM  Between 7am to 7pm - Pager - [phone]  After 7pm go to [email or web address] - password [email or web address]  [facility]   Office  [phone]

## 2014-11-03 NOTE — ED Notes (Signed)
Regular bed ordered by Diplomatic Services operational officersecretary.

## 2014-11-03 NOTE — ED Notes (Signed)
Pt taken to US

## 2014-11-03 NOTE — ED Notes (Signed)
Patient reports "I was prescribed 37.5 mg of metoprolol, but i only take 1 tablet, i won't take more than 1".

## 2014-11-04 ENCOUNTER — Encounter (HOSPITAL_COMMUNITY): Payer: Self-pay | Admitting: Cardiology

## 2014-11-04 ENCOUNTER — Ambulatory Visit (HOSPITAL_COMMUNITY): Payer: BC Managed Care – PPO

## 2014-11-04 ENCOUNTER — Observation Stay (HOSPITAL_COMMUNITY): Payer: BC Managed Care – PPO

## 2014-11-04 DIAGNOSIS — R7989 Other specified abnormal findings of blood chemistry: Secondary | ICD-10-CM

## 2014-11-04 DIAGNOSIS — I200 Unstable angina: Secondary | ICD-10-CM

## 2014-11-04 LAB — COMPREHENSIVE METABOLIC PANEL
ALBUMIN: 3.4 g/dL — AB (ref 3.5–5.0)
ALK PHOS: 102 U/L (ref 38–126)
ALT: 470 U/L — ABNORMAL HIGH (ref 17–63)
AST: 232 U/L — AB (ref 15–41)
Anion gap: 7 (ref 5–15)
BILIRUBIN TOTAL: 1.8 mg/dL — AB (ref 0.3–1.2)
BUN: 11 mg/dL (ref 6–20)
CALCIUM: 8.7 mg/dL — AB (ref 8.9–10.3)
CO2: 27 mmol/L (ref 22–32)
Chloride: 103 mmol/L (ref 101–111)
Creatinine, Ser: 1.14 mg/dL (ref 0.61–1.24)
GFR calc Af Amer: >60 mL/min (ref >60)
GLUCOSE: 96 mg/dL (ref 65–99)
POTASSIUM: 4.3 mmol/L (ref 3.5–5.1)
Sodium: 137 mmol/L (ref 135–145)
TOTAL PROTEIN: 5.8 g/dL — AB (ref 6.5–8.1)

## 2014-11-04 LAB — CBC
HEMATOCRIT: 37.6 % — AB (ref 39.0–52.0)
HEMOGLOBIN: 13.1 g/dL (ref 13.0–17.0)
MCH: 32.8 pg (ref 26.0–34.0)
MCHC: 34.8 g/dL (ref 30.0–36.0)
MCV: 94.2 fL (ref 78.0–100.0)
Platelets: 124 10*3/uL — ABNORMAL LOW (ref 150–400)
RBC: 3.99 MIL/uL — ABNORMAL LOW (ref 4.22–5.81)
RDW: 13 % (ref 11.5–15.5)
WBC: 4.3 10*3/uL (ref 4.0–10.5)

## 2014-11-04 MED ORDER — STERILE WATER FOR INJECTION IJ SOLN
INTRAMUSCULAR | Status: AC
Start: 2014-11-04 — End: 2014-11-04
  Filled 2014-11-04: qty 10

## 2014-11-04 MED ORDER — TECHNETIUM TC 99M MEBROFENIN IV KIT
5.2000 | PACK | Freq: Once | INTRAVENOUS | Status: DC | PRN
Start: 2014-11-04 — End: 2014-11-04
  Administered 2014-11-04: 5 via INTRAVENOUS
  Filled 2014-11-04: qty 6

## 2014-11-04 MED ORDER — SINCALIDE 5 MCG IJ SOLR
INTRAMUSCULAR | Status: AC
  Filled 2014-11-04: qty 5

## 2014-11-04 MED ORDER — ISOSORBIDE MONONITRATE ER 30 MG PO TB24: 30.0000 mg | ORAL_TABLET | Freq: Every day | ORAL | Status: DC

## 2014-11-04 MED ORDER — SINCALIDE 5 MCG IJ SOLR
0.0200 ug/kg | Freq: Once | INTRAMUSCULAR | Status: AC
  Administered 2014-11-04: 1.9 ug via INTRAVENOUS

## 2014-11-04 MED ORDER — CLOPIDOGREL BISULFATE 75 MG PO TABS: 75.0000 mg | ORAL_TABLET | Freq: Every day | ORAL | Status: DC

## 2014-11-04 NOTE — Progress Notes (Signed)
Patient Name:  Edgar BastRobert D Brooks, DOB: August 12, 1955, MRN: [id number] Primary Doctor: No primary care provider on file. Primary Cardiologist:   Date: 11/04/2014   SUBJECTIVE: The patient is feeling well today. He is not having any chest discomfort or abdominal pain. Cardiac catheterization was done yesterday from his left wrist. The left radial is stable. He has 3/3 patent grafts by catheterization. He does have significant distal disease. It was felt that medical therapy will be most appropriate at this time. He did not receive a stent. Plavix was started because of his overall disease and presentation.  The patient has elevated LFTs. He is having a HIDA scan today. Then decisions will be made about the approach. There has been mention of a possible drain. If it is felt that Plavix must be held, this could be considered as he did not have a stent placed yesterday.   Past Medical History  Diagnosis Date  . Hyperlipidemia   . CAD (coronary artery disease)     [facility]   Filed Vitals:   11/03/14 1820 11/03/14 2018 11/03/14 2040 11/04/14 0506  BP: 114/72 112/64 100/68 104/57  Pulse: 76 71 62 51  Temp:  98.3 F (36.8 C) 97.5 F (36.4 C) 97.4 F (36.3 C)  TempSrc:  Oral Oral Oral  Resp: 18 18 18 18   Height:      Weight:      SpO2: 98% 100% 100% 98%    Intake/Output Summary (Last 24 hours) at 11/04/14 0936 Last data filed at 11/03/14 1852  Gross per 24 hour  Intake    360 ml  Output    600 ml  Net   -240 ml   Filed Weights   11/02/14 2014 11/03/14 0820  Weight: 210 lb (95.255 kg) 210 lb (95.255 kg)     LABS: Basic Metabolic Panel:  Recent Labs  86/57/8410/18/16 0545 11/04/14 0227  NA 138 137  K 3.7 4.3  CL 104 103  CO2 25 27  GLUCOSE 110* 96  BUN 15 11  CREATININE 1.10 1.14  CALCIUM 8.9 8.7*   Liver Function Tests:  Recent Labs  11/03/14 0545 11/04/14 0227  AST 467* 232*  ALT 501* 470*  ALKPHOS 69 102  BILITOT 2.2* 1.8*  PROT 5.9* 5.8*    ALBUMIN 3.6 3.4*    Recent Labs  11/02/14 2137  LIPASE 28   CBC:  Recent Labs  11/02/14 2137 11/04/14 0227  WBC 9.0 4.3  HGB 13.4 13.1  HCT 38.0* 37.6*  MCV 91.6 94.2  PLT 122* 124*   Cardiac Enzymes:  Recent Labs  11/03/14 0301 11/03/14 0545 11/03/14 0829  TROPONINI <0.03 <0.03 <0.03   BNP: Invalid input(s): POCBNP D-Dimer: No results for input(s): DDIMER in the last 72 hours. Thyroid Function Tests: No results for input(s): TSH, T4TOTAL, T3FREE, THYROIDAB in the last 72 hours.  Invalid input(s): FREET3  RADIOLOGY: Dg Chest 2 View  11/02/2014  CLINICAL DATA:  Lower chest and mid stomach pain 1 hour ago, history coronary artery disease post triple bypass 2 years ago, tachypnea, pale and clammy on arrival, former smoker, hyperlipidemia EXAM: CHEST  2 VIEW COMPARISON:  12/14/2013 FINDINGS: Minimal enlargement of cardiac silhouette post CABG. Tortuous aorta. Pulmonary vascularity normal. Lungs clear. No pulmonary infiltrate, pleural effusion or pneumothorax. Bones unremarkable. IMPRESSION: Minimal enlargement of cardiac silhouette post CABG. No acute abnormalities. Electronically Signed   By: Ulyses SouthwardMark  Boles M.D.   On: 11/02/2014 21:09   [place]  Abdomen Limited Ruq  11/03/2014  CLINICAL DATA:  Sudden onset of epigastric abdominal pain tonight. Elevated LFTs. EXAM: US ABDOMEN LIMITED - RIGHT UPPER QUADRANT COMPARISON:  None. FINDINGS: Gallbladder: Physiologically distended containing shadowing stones and sludge. There is gallbladder wall thickening measuring up to 6 mm. No sonographic Murphy sign noted. Common bile duct: Diameter: 3.1 mm, normal Liver: No focal lesion identified. Diffusely heterogeneous in parenchymal echogenicity, however no discrete lesion. Normal directional blood flow in the main portal vein. IMPRESSION: 1. Gallstones and sludge in the gallbladder with mild diffuse gallbladder wall thickening. Sonographic Eulah Pont sign is negative. Findings may reflect  cholecystitis, acute or chronic, or can be seen in the setting of chronic liver disease. No biliary dilatation. 2. Heterogeneous hepatic parenchyma. No discrete lesion seen sonographically. Electronically Signed   By: Rubye Oaks M.D.   On: 11/03/2014 02:30    PHYSICAL EXAM  patient is stable overall. Cardiac exam reveals an S1 and S2. The left radial site is stable. I spoke with the patient's wife in the room.   ASSESSMENT AND PLAN:    Elevated LFTs     Hida scan today. Then further decisions will be made by the surgical team.      Coronary artery disease involving native coronary artery     The patient is stable post cath. He has 3/3 grafts patent. He does have significant distal disease. It was felt that PCI would not be the best approach. Aggressive medical therapy is needed. Plavix was added for overall therapy. If there is definite need to hold his Plavix, this can be considered as he did not have a PCI yesterday.     Willa Rough 11/04/2014 9:36 AM

## 2014-11-04 NOTE — Progress Notes (Signed)
Pt taken to Endoscopy for Hida scan.  CCMD notified of Pt transferring to endoscopy.

## 2014-11-04 NOTE — Progress Notes (Signed)
PROGRESS NOTE    Edgar Brooks WJX:[id number] DOB: 1955-08-19 DOA: 11/02/2014 PCP: No primary care provider on file.  HPI/Brief narrative 59 year old male with history of CAD status post three-vessel CABG 2014 at [facility] (followed by Dr.Bode in [facility]), HLD, admitted to [facility] on 11/03/14 with complaints of severe burning in his left chest with moderate exertion 2 weeks duration, severe substernal chest pain night prior to admission with associated dyspnea and diaphoresis-resolved after 2 doses of sublingual NTG. His symptoms were felt to be consistent with unstable angina. He also had elevated LFTs. Cardiology was consulted and patient underwent cardiac cath 10/18-3/3 patent grafts, significant distal disease, medical management recommended, patient did not receive a stent and Plavix was started because of overall disease and presentation. Undergoing HIDA scan and if positive, surgeons considering percutaneous cholecystostomy drain.   Assessment/Plan:  Chest pain suspicious for unstable angina - EKG without acute changes and troponins 3 negative. - Cardiology was consulted and patient underwent cardiac cath 10/18-3/3 patent grafts, significant distal disease, medical management recommended, patient did not receive a stent and Plavix was started because of overall disease and presentation.  - As per cardiology, if there is a definite need to hold his Plavix, this can be considered as he did not have a PCI 10/18.  Abdominal pain and abnormal LFTs - Concerning for gallstone/biliary etiology - No further symptoms and LFTs improving. - Ultrasound abdomen confirms gallstones and sludge in the gallbladder with mild diffuse gallbladder wall thickening but negative Murphy sign - Undergoing HIDA scan and if positive, surgeons considering percutaneous cholecystostomy drain. (Not stable for operative intervention due to cardiac risks) - ? If he passed a stone or  cholecystitis  CAD status post three-vessel CABG 2014 - Management and Findings as above.  Hyperlipidemia - Statins on hold secondary to abnormal LFTs  Thrombocytopenia - No baseline available. Platelets stable in the 120 range.   DVT prophylaxis: Subcutaneous heparin Code Status: Full Family Communication: Discussed with spouse at bedside Disposition Plan: To be determined. DC home when medically stable   Consultants:  Cardiology  General surgery  Procedures:  Left heart cath with coronary/graft angiography 11/03/14: Conclusion    1. Prox RCA to Dist RCA lesion, 100% stenosed. 2. Antegrade flow down the PDA with minimal disease in the PDA. Retrograde flow fills diffusely diseased RPL system 3. Ost LAD lesion, 80% stenosed. Mid LAD lesion, 100% stenosed. 4. LIMA is normal in caliber & patent to small caliber mLAD with Dist LAD lesion, 60% stenosed. 5. Prox Cx to Mid Cx lesion, 50% stenosed. 6. Ost 1st Mrg to 1st Mrg lesion, 100% stenosed. 7. SVG was injected is normal in caliber. JR4 catheter 8. Ramus-1 lesion, 80% stenosed. Ramus-2 lesion, 65% stenosed. 9. The left ventricular systolic function is normal. Normal LVEDP   Severe native CAD 3/3 Patent Grafts with diffuse distal disease in grafted vessels.  The Ramus may very well be the only potential PCI amenable target, but with the length of the diseased segment & vessel diameter - this is not an ideal target.  Would prefer medical management.   Plan:  Standard post Radial CATH Care with TR Band removal  Will Add Imdur for antianginal effect  Would monitor at least over night to reassess for angina symptoms.  Continue risk factor modification (although anticipate holding stain in light of elevated LFTs)     Antibiotics:  None  Subjective: Seen this morning prior to HIDA scan. Was nothing by mouth for procedure.  Denied chest pain, abdominal pain or any other complaints. Hungry.  Objective: Filed  Vitals:   11/03/14 2018 11/03/14 2040 11/04/14 0506 11/04/14 1157  BP: 112/64 100/68 104/57 112/70  Pulse: 71 62 51 60  Temp: 98.3 F (36.8 C) 97.5 F (36.4 C) 97.4 F (36.3 C) 97.6 F (36.4 C)  TempSrc: Oral Oral Oral   Resp: Height:      Weight:      SpO2: 100% 100% 98% 100%    Intake/Output Summary (Last 24 hours) at 11/04/14 1238 Last data filed at 11/03/14 1852  Gross per 24 hour  Intake    360 ml  Output    600 ml  Net   -240 ml   Filed Weights   11/02/14 2014 11/03/14 0820  Weight: 95.255 kg (210 lb) 95.255 kg (210 lb)     Exam:  General exam: Pleasant young male lying comfortably supine in bed. Respiratory system: Clear. No increased work of breathing. Cardiovascular system: S1 & S2 heard, RRR. No JVD, murmurs, gallops, clicks or pedal edema. Telemetry: Sinus bradycardia in the 40s-sinus rhythm Gastrointestinal system: Abdomen is nondistended, soft and nontender. Normal bowel sounds heard. Central nervous system: Alert and oriented. No focal neurological deficits. Extremities: Symmetric 5 x 5 power.   Data Reviewed: Basic Metabolic Panel:  Recent Labs Lab 11/02/14 2137 11/03/14 0545 11/04/14 0227  NA 137 138 137  K 3.8 3.7 4.3  CL 106 104 103  CO2 GLUCOSE 129* 110* 96  BUN CREATININE 1.12 1.10 1.14  CALCIUM 8.8* 8.9 8.7*   Liver Function Tests:  Recent Labs Lab 11/02/14 2137 11/03/14 0545 11/04/14 0227  AST 203* 467* 232*  ALT 159* 501* 470*  ALKPHOS 54 69 102  BILITOT 1.4* 2.2* 1.8*  PROT 6.4* 5.9* 5.8*  ALBUMIN 3.8 3.6 3.4*    Recent Labs Lab 11/02/14 2137  LIPASE 28   No results for input(s): AMMONIA in the last 168 hours. CBC:  Recent Labs Lab 11/02/14 2137 11/04/14 0227  WBC 9.0 4.3  HGB 13.4 13.1  HCT 38.0* 37.6*  MCV 91.6 94.2  PLT 122* 124*   Cardiac Enzymes:  Recent Labs Lab 11/03/14 0301 11/03/14 0545 11/03/14 0829  TROPONINI <0.03 <0.03 <0.03   BNP (last 3 results) No  results for input(s): PROBNP in the last 8760 hours. CBG: No results for input(s): GLUCAP in the last 168 hours.  No results found for this or any previous visit (from the past 240 hour(s)).         Studies: Dg Chest 2 View  11/02/2014  CLINICAL DATA:  Lower chest and mid stomach pain 1 hour ago, history coronary artery disease post triple bypass 2 years ago, tachypnea, pale and clammy on arrival, former smoker, hyperlipidemia EXAM: CHEST  2 VIEW COMPARISON:  12/14/2013 FINDINGS: Minimal enlargement of cardiac silhouette post CABG. Tortuous aorta. Pulmonary vascularity normal. Lungs clear. No pulmonary infiltrate, pleural effusion or pneumothorax. Bones unremarkable. IMPRESSION: Minimal enlargement of cardiac silhouette post CABG. No acute abnormalities. Electronically Signed   By: Ulyses Southward M.D.   On: 11/02/2014 21:09   US Abdomen Limited Ruq  11/03/2014  CLINICAL DATA:  Sudden onset of epigastric abdominal pain tonight. Elevated LFTs. EXAM: US ABDOMEN LIMITED - RIGHT UPPER QUADRANT COMPARISON:  None. FINDINGS: Gallbladder: Physiologically distended containing shadowing stones and sludge. There is gallbladder wall thickening measuring up to 6 mm. No sonographic Murphy sign noted. Common bile duct:  Diameter: 3.1 mm, normal Liver: No focal lesion identified. Diffusely heterogeneous in parenchymal echogenicity, however no discrete lesion. Normal directional blood flow in the main portal vein. IMPRESSION: 1. Gallstones and sludge in the gallbladder with mild diffuse gallbladder wall thickening. Sonographic Eulah PontMurphy sign is negative. Findings may reflect cholecystitis, acute or chronic, or can be seen in the setting of chronic liver disease. No biliary dilatation. 2. Heterogeneous hepatic parenchyma. No discrete lesion seen sonographically. Electronically Signed   By: Rubye OaksMelanie  Ehinger M.D.   On: 11/03/2014 02:30        Scheduled Meds: . sincalide      . sterile water (preservative free)        . aspirin EC  81 mg Oral Daily  . clopidogrel  300 mg Oral Once  . clopidogrel  75 mg Oral Q breakfast  . fentaNYL (SUBLIMAZE) injection  50 mcg Intravenous Once  . heparin subcutaneous  5,000 Units Subcutaneous 3 times per day  . isosorbide mononitrate  30 mg Oral Daily  . metoprolol tartrate  37.5 mg Oral BID  . sodium chloride  3 mL Intravenous Q12H   Continuous Infusions:   Principal Problem:   Unstable angina (HCC) Active Problems:   Elevated LFTs   Chest pain   Coronary artery disease involving native coronary artery    Time spent: 25 minutes.    Marcellus ScottHONGALGI,Blanche Gallien, MD, FACP, FHM. [facility] Pager [phone]  If 7PM-7AM, please contact night-coverage [email or web address] Password [email or web address] 11/04/2014, 12:38 PM

## 2014-11-04 NOTE — Progress Notes (Signed)
HIDA scan normal.  LFTs trending down today.  Will place on heart healthy diet.  He can have his LFTs checked in one week by his primary to ensure they are normalizing.  He is stable from our standpoint for DC home.  He may follow up with us in our office in the future to discuss elective cholecystectomy.  Edgar Brooks E 2:25 PM 11/04/2014

## 2014-11-04 NOTE — Discharge Summary (Signed)
Physician Discharge Summary  Edgar BastRobert D Mayhan RUE:[id number] DOB: Nov 18, 1955 DOA: 11/02/2014  PCP: Arlyss QueenONROY,NATHAN, PA-C  Admit date: 11/02/2014 Discharge date: 11/04/2014  Time spent: Less than 30 minutes  Recommendations for Outpatient Follow-up:  1. Lonie PeakNathan Conroy, PCP in one week with repeat labs (CBC & CMP) 2. Dr. Patterson HammersmithBodie, Cardiology in 2 weeks 3. Dr. Abigail Miyamotoouglas Blackman, General surgery-as needed for follow-up regarding elective cholecystectomy  Discharge Diagnoses:  Principal Problem:   Unstable angina [facility](HCC) Active Problems:   Elevated LFTs   Chest pain   Coronary artery disease involving native coronary artery   Discharge Condition: Improved & Stable  Diet recommendation: Heart healthy diet.  Filed Weights   11/02/14 2014 11/03/14 0820  Weight: 95.255 kg (210 lb) 95.255 kg (210 lb)    History of present illness:  59 year old male with history of CAD status post three-vessel CABG 2014 at [facility] HLD, [facility]14 with complaints of severe burning in his left chest with moderate exertion 2 weeks duration, severe substernal chest pain night prior to admission with associated dyspnea and diaphoresis-resolved after 2 doses of sublingual NTG. His symptoms were felt to be consistent with unstable angina. He also had elevated LFTs. Cardiology was consulted and patient underwent cardiac cath 10/18-3/3 patent grafts, significant distal disease, medical management recommended, patient did not receive a stent and Plavix was started because of overall disease and presentation. HIDA scan negative. Patient tolerated diet. Surgeons have cleared him for discharge.  Hospital Course:   Chest pain suspicious for unstable angina - EKG without acute changes and troponins 3 negative. - Cardiology was consulted and patient underwent cardiac cath 10/18-3/3 patent grafts, significant distal disease, medical management  recommended, patient did not receive a stent and Plavix was started because of overall disease and presentation.  - As per cardiology, if there is a definite need to hold his Plavix, this can be considered as he did not have a PCI 10/18.  Abdominal pain and abnormal LFTs - Concerning for gallstone/biliary etiology - No further symptoms and LFTs improving. - Ultrasound abdomen confirms gallstones and sludge in the gallbladder with mild diffuse gallbladder wall thickening but negative Murphy sign - ? If he passed a stone or cholecystitis - HIDA scan negative. Surgeons started him on regular diet which she has tolerated. Surgeons have cleared him for discharge home. Outpatient follow-up with repeat LFTs in 1 weeks time. - May consider further workup including acute hepatitis panel if LFTs don't continue to improve/normalized.  CAD status post three-vessel CABG 2014 - Management and Findings as above.  Hyperlipidemia - Statins on hold secondary to abnormal LFTs. May consider resuming during outpatient follow-up. Discussed about this with patient's spouse.  Thrombocytopenia - No baseline available. Platelets stable in the 120 range.    Consultants:  Cardiology  General surgery  Procedures:  Left heart cath with coronary/graft angiography 11/03/14: Conclusion    1. Prox RCA to Dist RCA lesion, 100% stenosed. 2. Antegrade flow down the PDA with minimal disease in the PDA. Retrograde flow fills diffusely diseased RPL system 3. Ost LAD lesion, 80% stenosed. Mid LAD lesion, 100% stenosed. 4. LIMA is normal in caliber & patent to small caliber mLAD with Dist LAD lesion, 60% stenosed. 5. Prox Cx to Mid Cx lesion, 50% stenosed. 6. Ost 1st Mrg to 1st Mrg lesion, 100% stenosed. 7. SVG was injected is normal in caliber. JR4 catheter 8. Ramus-1 lesion, 80% stenosed. Ramus-2 lesion, 65% stenosed. 9. The  left ventricular systolic function is normal. Normal LVEDP   Severe native CAD 3/3  Patent Grafts with diffuse distal disease in grafted vessels.  The Ramus may very well be the only potential PCI amenable target, but with the length of the diseased segment & vessel diameter - this is not an ideal target.  Would prefer medical management.   Plan:  Standard post Radial CATH Care with TR Band removal  Will Add Imdur for antianginal effect  Would monitor at least over night to reassess for angina symptoms.  Continue risk factor modification (although anticipate holding stain in light of elevated LFTs)     Antibiotics:  None        Discharge Exam:  Complaints: Tolerated lean cuisine without abdominal pain, nausea or vomiting  Filed Vitals:   11/03/14 2018 11/03/14 2040 11/04/14 0506 11/04/14 1157  BP: 112/64 100/68 104/57 112/70  Pulse: 71 62 51 60  Temp: 98.3 F (36.8 C) 97.5 F (36.4 C) 97.4 F (36.3 C) 97.6 F (36.4 C)  TempSrc: Oral Oral Oral   Resp: Height:      Weight:      SpO2: 100% 100% 98% 100%    General exam: Pleasant young male lying comfortably supine in bed. Respiratory system: Clear. No increased work of breathing. Cardiovascular system: S1 & S2 heard, RRR. No JVD, murmurs, gallops, clicks or pedal edema. Telemetry: Sinus bradycardia in the 40s-sinus rhythm Gastrointestinal system: Abdomen is nondistended, soft and nontender. Normal bowel sounds heard. Central nervous system: Alert and oriented. No focal neurological deficits. Extremities: Symmetric 5 x 5 power.  Discharge Instructions      Discharge Instructions    Call MD for:  difficulty breathing, headache or visual disturbances    Complete by:  As directed      Call MD for:  extreme fatigue    Complete by:  As directed      Call MD for:  persistant dizziness or light-headedness    Complete by:  As directed      Call MD for:  persistant nausea and vomiting    Complete by:  As directed      Call MD for:  severe uncontrolled pain    Complete by:  As  directed      Call MD for:  temperature >100.4    Complete by:  As directed      Diet - low sodium heart healthy    Complete by:  As directed      Increase activity slowly    Complete by:  As directed             Medication List    STOP taking these medications        atorvastatin 80 MG tablet  Commonly known as:  LIPITOR      TAKE these medications        aspirin EC 81 MG tablet  Take 81 mg by mouth daily.     clopidogrel 75 MG tablet  Commonly known as:  PLAVIX  Take 1 tablet (75 mg total) by mouth daily with breakfast.     isosorbide mononitrate 30 MG 24 hr tablet  Commonly known as:  IMDUR  Take 1 tablet (30 mg total) by mouth daily.     metoprolol tartrate 25 MG tablet  Commonly known as:  LOPRESSOR  Take 37.5 mg by mouth 2 (two) times daily.     nitroGLYCERIN 0.4 MG SL tablet  Commonly known as:  NITROSTAT  PLACE 1 TABLET UNDER TONGUE EVERY 5 MINS AS NEEDED FOR CHEST PAIN CALL 911/EMS BY 3RD DOSE     temazepam 15 MG capsule  Commonly known as:  RESTORIL  Take 15 mg by mouth at bedtime as needed.       Follow-up Information    Follow up with [facility], MD.   Specialty:  General Surgery   Why:  As needed, in the future to consider removal of your gallbladder secondary to gallstones   Contact information:   [address] [place] [address] [phone]       Follow up with CONROY,NATHAN, PA-C. Schedule an appointment as soon as possible for a visit in 1 week.   Specialty:  Physician Assistant   Why:  To be seen with repeat labs (CBC & CMP).   Contact information:   [facility] [address] [place] [address] [phone]       Follow up with Dr. Patterson Hammersmith, Cardiology. Schedule an appointment as soon as possible for a visit in 2 weeks.       The results of significant diagnostics from this hospitalization (including imaging, microbiology, ancillary and laboratory) are listed below for reference.     Significant Diagnostic Studies: Dg Chest 2 View  11/02/2014  CLINICAL DATA:  Lower chest and mid stomach pain 1 hour ago, history coronary artery disease post triple bypass 2 years ago, tachypnea, pale and clammy on arrival, former smoker, hyperlipidemia EXAM: CHEST  2 VIEW COMPARISON:  12/14/2013 FINDINGS: Minimal enlargement of cardiac silhouette post CABG. Tortuous aorta. Pulmonary vascularity normal. Lungs clear. No pulmonary infiltrate, pleural effusion or pneumothorax. Bones unremarkable. IMPRESSION: Minimal enlargement of cardiac silhouette post CABG. No acute abnormalities. Electronically Signed   By: Ulyses Southward M.D.   On: 11/02/2014 21:09   Nm Hepatobiliary Liver Func  11/04/2014  CLINICAL DATA:  Mid abdominal pain, sternal pain on 11/02/2014. EXAM: NUCLEAR MEDICINE HEPATOBILIARY IMAGING WITH GALLBLADDER EF TECHNIQUE: Sequential images of the abdomen were obtained out to 60 minutes following intravenous administration of radiopharmaceutical. After slow intravenous infusion of 1.9 micrograms Cholecystokinin, gallbladder ejection fraction was determined. RADIOPHARMACEUTICALS:  5.2 mCi Tc-36m Choletec IV COMPARISON:  None. FINDINGS: There is prompt uptake and excretion of radiotracer by the liver. No evidence of cystic duct or common bile duct obstruction. Gallbladder ejection fraction is 92%. At 45 min, normal ejection fraction is greater than 40%. IMPRESSION: Normal study. Electronically Signed   By: Charlett Nose M.D.   On: 11/04/2014 14:01   US Abdomen Limited Ruq  11/03/2014  CLINICAL DATA:  Sudden onset of epigastric abdominal pain tonight. Elevated LFTs. EXAM: US ABDOMEN LIMITED - RIGHT UPPER QUADRANT COMPARISON:  None. FINDINGS: Gallbladder: Physiologically distended containing shadowing stones and sludge. There is gallbladder wall thickening measuring up to 6 mm. No sonographic Murphy sign noted. Common bile duct: Diameter: 3.1 mm, normal Liver: No focal lesion identified. Diffusely  heterogeneous in parenchymal echogenicity, however no discrete lesion. Normal directional blood flow in the main portal vein. IMPRESSION: 1. Gallstones and sludge in the gallbladder with mild diffuse gallbladder wall thickening. Sonographic Eulah Pont sign is negative. Findings may reflect cholecystitis, acute or chronic, or can be seen in the setting of chronic liver disease. No biliary dilatation. 2. Heterogeneous hepatic parenchyma. No discrete lesion seen sonographically. Electronically Signed   By: Rubye Oaks M.D.   On: 11/03/2014 02:30    Microbiology: No results found for this or any previous visit (from the past 240  hour(s)).   Labs: Basic Metabolic Panel:  Recent Labs Lab 11/02/14 2137 11/03/14 0545 11/04/14 0227  NA 137 138 137  K 3.8 3.7 4.3  CL 106 104 103  CO2 25 25 27   GLUCOSE 129* 110* 96  BUN 16 15 11   CREATININE 1.12 1.10 1.14  CALCIUM 8.8* 8.9 8.7*   Liver Function Tests:  Recent Labs Lab 11/02/14 2137 11/03/14 0545 11/04/14 0227  AST 203* 467* 232*  ALT 159* 501* 470*  ALKPHOS 54 69 102  BILITOT 1.4* 2.2* 1.8*  PROT 6.4* 5.9* 5.8*  ALBUMIN 3.8 3.6 3.4*    Recent Labs Lab 11/02/14 2137  LIPASE 28   No results for input(s): AMMONIA in the last 168 hours. CBC:  Recent Labs Lab 11/02/14 2137 11/04/14 0227  WBC 9.0 4.3  HGB 13.4 13.1  HCT 38.0* 37.6*  MCV 91.6 94.2  PLT 122* 124*   Cardiac Enzymes:  Recent Labs Lab 11/03/14 0301 11/03/14 0545 11/03/14 0829  TROPONINI <0.03 <0.03 <0.03   BNP: BNP (last 3 results) No results for input(s): BNP in the last 8760 hours.  ProBNP (last 3 results) No results for input(s): PROBNP in the last 8760 hours.  CBG: No results for input(s): GLUCAP in the last 168 hours.      Signed:  Marcellus Scott, MD, FACP, FHM. [facility] Pager [phone]  If 7PM-7AM, please contact night-coverage [email or web address] Password [email or web address] 11/04/2014, 4:25 PM

## 2014-11-04 NOTE — Progress Notes (Signed)
Pt discharge education given with family at bedside.  All questions answered.  Telemetry box removed and CCMD notified.  IV site to right forearm discontinued with intact catheter.  Pt belongings with Pt at discharge.   [facility]  Pt stable with no S/S of distress.

## 2014-11-04 NOTE — Progress Notes (Signed)
Patient ID: Edgar Brooks, male   DOB: 05-21-1955, 59 y.o.   MRN: [id number] 1 Day Post-Op  Subjective: Pt feels well this morning.    Objective: Vital signs in last 24 hours: Temp:  [97.4 F (36.3 C)-98.3 F (36.8 C)] 97.4 F (36.3 C) (10/19 0506) Pulse Rate:  [0-288] 51 (10/19 0506) Resp:  [0-56] 18 (10/19 0506) BP: (100-138)/(57-90) 104/57 mmHg (10/19 0506) SpO2:  [0 %-100 %] 98 % (10/19 0506) Weight:  [95.255 kg (210 lb)] 95.255 kg (210 lb) (10/18 0820) Last BM Date: 11/03/14  Intake/Output from previous day: 10/18 0701 - 10/19 0700 In: 360 [P.O.:360] Out: 600 [Urine:600] Intake/Output this shift:    PE: Abd: soft, NT, ND, +BS  Lab Results:   Recent Labs  11/02/14 2137 11/04/14 0227  WBC 9.0 4.3  HGB 13.4 13.1  HCT 38.0* 37.6*  PLT 122* 124*   BMET  Recent Labs  11/03/14 0545 11/04/14 0227  NA 138 137  K 3.7 4.3  CL 104 103  CO2 25 27  GLUCOSE 110* 96  BUN 15 11  CREATININE 1.10 1.14  CALCIUM 8.9 8.7*   PT/INR  Recent Labs  11/03/14 1143  LABPROT 14.2  INR 1.08   CMP     Component Value Date/Time   NA 137 11/04/2014 0227   K 4.3 11/04/2014 0227   CL 103 11/04/2014 0227   CO2 27 11/04/2014 0227   GLUCOSE 96 11/04/2014 0227   BUN 11 11/04/2014 0227   CREATININE 1.14 11/04/2014 0227   CALCIUM 8.7* 11/04/2014 0227   PROT 5.8* 11/04/2014 0227   ALBUMIN 3.4* 11/04/2014 0227   AST 232* 11/04/2014 0227   ALT 470* 11/04/2014 0227   ALKPHOS 102 11/04/2014 0227   BILITOT 1.8* 11/04/2014 0227   GFRNONAA >60 11/04/2014 0227   GFRAA >60 11/04/2014 0227   Lipase     Component Value Date/Time   LIPASE 28 11/02/2014 2137       Studies/Results: Dg Chest 2 View  11/02/2014  CLINICAL DATA:  Lower chest and mid stomach pain 1 hour ago, history coronary artery disease post triple bypass 2 years ago, tachypnea, pale and clammy on arrival, former smoker, hyperlipidemia EXAM: CHEST  2 VIEW COMPARISON:  12/14/2013 FINDINGS: Minimal enlargement  of cardiac silhouette post CABG. Tortuous aorta. Pulmonary vascularity normal. Lungs clear. No pulmonary infiltrate, pleural effusion or pneumothorax. Bones unremarkable. IMPRESSION: Minimal enlargement of cardiac silhouette post CABG. No acute abnormalities. Electronically Signed   By: Ulyses Southward M.D.   On: 11/02/2014 21:09   US Abdomen Limited Ruq  11/03/2014  CLINICAL DATA:  Sudden onset of epigastric abdominal pain tonight. Elevated LFTs. EXAM: US ABDOMEN LIMITED - RIGHT UPPER QUADRANT COMPARISON:  None. FINDINGS: Gallbladder: Physiologically distended containing shadowing stones and sludge. There is gallbladder wall thickening measuring up to 6 mm. No sonographic Murphy sign noted. Common bile duct: Diameter: 3.1 mm, normal Liver: No focal lesion identified. Diffusely heterogeneous in parenchymal echogenicity, however no discrete lesion. Normal directional blood flow in the main portal vein. IMPRESSION: 1. Gallstones and sludge in the gallbladder with mild diffuse gallbladder wall thickening. Sonographic Eulah Pont sign is negative. Findings may reflect cholecystitis, acute or chronic, or can be seen in the setting of chronic liver disease. No biliary dilatation. 2. Heterogeneous hepatic parenchyma. No discrete lesion seen sonographically. Electronically Signed   By: Rubye Oaks M.D.   On: 11/03/2014 02:30    Anti-infectives: Anti-infectives    None       Assessment/Plan  1. Elevated LFTs -trending down today, ? If he passed a stone or cholecystitis.  HIDA scan pending.  Patient has no abdominal pain or WBC though. -given below cath findings, if patient's HIDA is positive, he will likely require a perc chole drain as he would not be stable for operative intervention at this time. 2. Unstable angina -cath results reviewed. -he was started on plavix yesterday as the location of stenosis was not amendable to PCI       Veleda Mun E 11/04/2014, 7:56 AM Pager: [phone]

## 2014-11-04 NOTE — Discharge Instructions (Signed)
Angina Pectoris Angina pectoris, often called angina, is extreme discomfort in the chest, neck, or arm. This is caused by a lack of blood in the middle and thickest layer of the heart wall (myocardium). There are four types of angina:  Stable angina. Stable angina usually occurs in episodes of predictable frequency and duration. It is usually brought on by physical activity, stress, or excitement. Stable angina usually lasts a few minutes and can often be relieved by a medicine that you place under your tongue. This medicine is called sublingual nitroglycerin.  Unstable angina. Unstable angina can occur even when you are doing little or no physical activity. It can even occur while you are sleeping or when you are at rest. It can suddenly increase in severity or frequency. It may not be relieved by sublingual nitroglycerin, and it can last up to 30 minutes.  Microvascular angina. This type of angina is caused by a disorder of tiny blood vessels called arterioles. Microvascular angina is more common in women. The pain may be more severe and last longer than other types of angina pectoris.  Prinzmetal or variant angina. This type of angina pectoris is rare and usually occurs when you are doing little or no physical activity. It especially occurs in the early morning hours. CAUSES Atherosclerosis is the cause of angina. This is the buildup of fat and cholesterol (plaque) on the inside of the arteries. Over time, the plaque may narrow or block the artery, and this will lessen blood flow to the heart. Plaque can also become weak and break off within a coronary artery to form a clot and cause a sudden blockage. RISK FACTORS Risk factors common to both men and women include:  High cholesterol levels.  High blood pressure (hypertension).  Tobacco use.  Diabetes.  Family history of angina.  Obesity.  Lack of exercise.  A diet high in saturated fats. Women are at greater risk for angina if they  are:  Over age 53.  Postmenopausal. SYMPTOMS Many people do not experience any symptoms during the early stages of angina. As the condition progresses, symptoms common to both men and women may include:  Chest pain.  The pain can be described as a crushing or squeezing in the chest, or a tightness, pressure, fullness, or heaviness in the chest.  The pain can last more than a few minutes, or it can stop and recur.  Pain in the arms, neck, jaw, or back.  Unexplained heartburn or indigestion.  Shortness of breath.  Nausea.  Sudden cold sweats.  Sudden light-headedness. Many women have chest discomfort and some of the other symptoms. However, women often have different (atypical) symptoms, such as:   Fatigue.  Unexplained feelings of nervousness or anxiety.  Unexplained weakness.  Dizziness or fainting. Sometimes, women may have angina without any symptoms. DIAGNOSIS  Tests to diagnose angina may include:  ECG (electrocardiogram).  Exercise stress test. This looks for signs of blockage when the heart is being exercised.  Pharmacologic stress test. This test looks for signs of blockage when the heart is being stressed with a medicine.  Blood tests.  Coronary angiogram. This is a procedure to look at the coronary arteries to see if there is any blockage. TREATMENT  The treatment of angina may include the following:  Healthy behavioral changes to reduce or control risk factors.  Medicine.  Coronary stenting.A stent helps to keep an artery open.  Coronary angioplasty. This procedure widens a narrowed or blocked artery.  Coronary arterybypass  surgery. This will allow your blood to pass the blockage (bypass) to reach your heart. HOME CARE INSTRUCTIONS   Take medicines only as directed by your health care provider.  Do not take the following medicines unless your health care provider approves:  Nonsteroidal anti-inflammatory drugs (NSAIDs), such as ibuprofen,  naproxen, or celecoxib.  Vitamin supplements that contain vitamin A, vitamin E, or both.  Hormone replacement therapy that contains estrogen with or without progestin.  Manage other health conditions such as hypertension and diabetes as directed by your health care provider.  Follow a heart-healthy diet. A dietitian can help to educate you about healthy food options and changes.  Use healthy cooking methods such as roasting, grilling, broiling, baking, poaching, steaming, or stir-frying. Talk to a dietitian to learn more about healthy cooking methods.  Follow an exercise program approved by your health care provider.  Maintain a healthy weight. Lose weight as approved by your health care provider.  Plan rest periods when fatigued.  Learn to manage stress.  Do not use any tobacco products, including cigarettes, chewing tobacco, or electronic cigarettes. If you need help quitting, ask your health care provider.  If you drink alcohol, and your health care provider approves, limit your alcohol intake to no more than 1 drink per day. One drink equals 12 ounces of beer, 5 ounces of wine, or 1 ounces of hard liquor.  Stop illegal drug use.  Keep all follow-up visits as directed by your health care provider. This is important. SEEK IMMEDIATE MEDICAL CARE IF:   You have pain in your chest, neck, arm, jaw, stomach, or back that lasts more than a few minutes, is recurring, or is unrelieved by taking sublingualnitroglycerin.  You have profuse sweating without cause.  You have unexplained:  Heartburn or indigestion.  Shortness of breath or difficulty breathing.  Nausea or vomiting.  Fatigue.  Feelings of nervousness or anxiety.  Weakness.  Diarrhea.  You have sudden light-headedness or dizziness.  You faint. These symptoms may represent a serious problem that is an emergency. Do not wait to see if the symptoms will go away. Get medical help right away. Call your local  emergency services (911 in the [place] Do not drive yourself to the hospital.   This information is not intended to replace advice given to you by your health care provider. Make sure you discuss any questions you have with your health care provider.   Document Released: 01/02/2005 Document Revised: 01/23/2014 Document Reviewed: 05/06/2013 Elsevier Interactive Patient Education 2016 Elsevier Inc.  Liver Function Tests Liver function tests are blood tests to see how well your liver is working. The proteins and enzymes measured in the test can alert your health care provider to inflammation, damage, or disease in your liver. It is common to have liver function tests:  During annual physical exams.  When you are taking certain medicines.  If you have liver disease.  If you drink a lot of alcohol.  When you are not feeling well.  When you have other conditions that may affect the liver. Substances measured may include:   Alanine transaminase (ALT). This is an enzyme in the liver.  Aspartate transaminase (AST). This is an enzyme in the liver, heart, and muscles.  Alkaline phosphatase (ALP). This is a protein in the liver, bile ducts, and bone. It is also in other body tissues.  Total bilirubin. This is a yellow pigment in bile.  Albumin. This is a protein in the liver.  Prothrombin time and  international normalized ratio (PT and INR). PT measures the time that it takes for your blood to clot. INR is a calculation of blood clotting time based upon your PT result. It is also calculated based on normal ranges defined by the laboratory that processed your lab test.  Total protein. This measures two proteins, albumin and globulin, found in the blood. PREPARATION FOR TEST How you prepare will depend on which tests are being done and the reason why these tests are being done. You may need to:  Avoid eating for 4-6 hours before the test or as directed by your health care provider.  Stop  taking certain medicines prior to your blood test as directed by your health care provider. RESULTS  It is your responsibility to obtain your test results. Ask the lab or department performing the test when and how you will get your results. Contact your health care provider to discuss any questions you have about your results. RANGE OF NORMAL VALUES Ranges for normal values may vary among different labs and hospitals. You should always check with your health care provider after having lab work or other tests done to discuss the meaning of your test results and whether your values are considered within normal limits. The following are normal ranges for substances measured in liver function tests: ALT  Infant: may be twice as high as adult values.  Child or adult: 4-36 international units/L at 37C or 4-36 units/L (SI units).  Elderly: may be slightly higher than adult values. AST  Newborn 850-605 days old: 35-140 units/L.  Child under 57 years old: 15-60 units/L.  533-57 years old: 15-50 units/L.  656-57 years old: 10-50 units/L.  212-57 years old: 10-40 units/L.  Adult: 0-35 units/L or 0-0.58 microkatal/L (SI units).  Elderly: slightly higher than adults. ALP  Child under 57 years old: 85-235 units/L.  192-57 years old: 65-210 units/L.  359-57 years old: 60-300 units/L.  5216-57 years old: 30-200 units/L.  Adult: 30-120 units/L or 0.5-2.0 microkatal/L (SI units).  Elderly: slightly higher than adult. Total bilirubin  Newborn: 1.0-12.0 mg/dL or 96.0-45417.1-205 micromoles/L (SI units).  Adult, elderly, or child: 0.3-1.0 mg/dL or 0.9-815.1-17 micromoles/L. Albumin  Premature infant: 3.0-4.2 g/dL.  Newborn: 3.5-5.4 g/dL.  Infant: 4.4-5.4 g/dL.  Child: 4.0-5.9 g/dL.  Adult or elderly: 3.5-5.0 g/dL or 19-1435-50 g/L (SI units). PT  11.0-12.5 seconds; 85%-100%. INR  0.8-1.1. Total protein  Premature infant: 4.2-7.6 g/dL.  Newborn: 4.6-7.4 g/dL.  Infant: 6.0-6.7 g/dL.  Child: 6.2-8.0  g/dL.  Adult or elderly: 6.4-8.3 g/dL or 78-2964-83 g/L (SI units). MEANING OF RESULTS OUTSIDE NORMAL VALUE RANGES Sometimes test results can be abnormal due to other factors, such as medicines, exercise, or pregnancy. Follow up with your health care provider if you have any questions about test results outside the normal value ranges. ALT  Levels above the normal range, along with other test results, may indicate liver disease. AST  Levels above the normal range, along with other test results, may indicate liver disease. Sometimes levels also increase after burns, surgery, heart attack, muscle damage, or seizure. ALP  Levels above the normal range, along with other test results, may indicate biliary obstruction, diseases of the liver, bone disease, thyroid disease, tumors, fractures, leukemia or lymphoma, or several other conditions. People with blood type O or B may show higher levels after a fatty meal.  Levels below the normal range, along with other test results, may indicate bone and teeth conditions, malnutrition, protein deficiency, or Wilson disease. Total  bilirubin  Levels above the normal range, along with other test results, may indicate problems with the liver, gallbladder, or bile ducts. Albumin  Levels above the normal range, along with other test results, may indicate dehydration. They may also be caused by a diet that is high in protein. Sometimes, the band placed around the upper arm during the process of drawing blood can cause the level of this protein in your blood to rise and give you a result above the normal range.  Levels below the normal range, along with other tests results, may indicate kidney disease, liver disease, or malabsorption of nutrients. PT and INR  Levels above the normal range mean your blood is clotting slower than normal. This may be due to blood disorders, liver disorders, or low levels of vitamin K. Total protein  Levels above the normal range,  along with other test results, may be due to infection or other diseases.  Levels below the normal range, along with other test results, may be due to an immune system disorder, bleeding, burns, kidney disorder, liver disease, trouble absorbing or getting enough nutrients, or other conditions that affect the intestines.   This information is not intended to replace advice given to you by your health care provider. Make sure you discuss any questions you have with your health care provider.   Document Released: 02/05/2004 Document Revised: 01/23/2014 Document Reviewed: 05/08/2013 Elsevier Interactive Patient Education [employer]

## 2014-11-16 ENCOUNTER — Telehealth: Payer: Self-pay | Admitting: Cardiology

## 2014-11-16 NOTE — Telephone Encounter (Signed)
[facility] 01/21/15 with Dr Herbie BaltimoreHarding.  [facility] for Dr Elissa HeftyHarding's schedule on 01/21/15.

## 2015-01-21 ENCOUNTER — Ambulatory Visit (INDEPENDENT_AMBULATORY_CARE_PROVIDER_SITE_OTHER): Payer: BC Managed Care – PPO | Admitting: Cardiology

## 2015-01-21 VITALS — BP 124/82 | HR 65 | Ht 70.0 in | Wt 213.1 lb

## 2015-01-21 DIAGNOSIS — E785 Hyperlipidemia, unspecified: Secondary | ICD-10-CM

## 2015-01-21 DIAGNOSIS — I4891 Unspecified atrial fibrillation: Secondary | ICD-10-CM

## 2015-01-21 DIAGNOSIS — I9789 Other postprocedural complications and disorders of the circulatory system, not elsewhere classified: Secondary | ICD-10-CM

## 2015-01-21 DIAGNOSIS — I25119 Atherosclerotic heart disease of native coronary artery with unspecified angina pectoris: Secondary | ICD-10-CM

## 2015-01-21 DIAGNOSIS — I200 Unstable angina: Secondary | ICD-10-CM

## 2015-01-21 NOTE — Progress Notes (Signed)
PCP: Lonie Peak PA-C  Clinic Note: Chief Complaint  Patient presents with  . New Evaluation    CAD//referred by Dr. Conroy(PCP)//post CATH//pt has questions about medications, and about what kind of cold medications he can take OTC.  [name] Coronary Artery Disease    HPI: Edgar Brooks is a 60 y.o. male with a PMH below who presents today for establishing Cardiology Care.  Edgar Brooks was last seen in the Hospital In Oct 2016 - had LHC Recent Hospitalizations: Nov 03, 2014: history of CAD s/p 3V CABG in 2014 at [facility], HLD admitted with severe burning in his left chest with moderate exertion for 2 weeks. Severe substernal chest pain on the night PTA with associated dyspnea, diaphoresis. This resolved with NTG. . Troponin negative x 1. EKG without ischemia changes. Chest x-ray clear. No chest pain at rest this am. No SOB this am. No recent weight change, LE edema. He has never been a smoker and does not have DM or family history of CAD.   - referred for cardiac cath --> He has followed with Dr. Erline Levine in [facility] since his bypass, but would like to transfer care to [place].  Studies Reviewed: Cath 11/03/2014   Conclusion    1. Prox RCA to Dist RCA lesion, 100% stenosed. 2. Antegrade flow down the PDA with minimal disease in the PDA. Retrograde flow fills diffusely diseased RPL system 3. Ost LAD lesion, 80% stenosed. Mid LAD lesion, 100% stenosed. 4. LIMA is normal in caliber & patent to small caliber mLAD with Dist LAD lesion, 60% stenosed. 5. Prox Cx to Mid Cx lesion, 50% stenosed. 6. Ost 1st Mrg to 1st Mrg lesion, 100% stenosed. -- This is actually the Inferior RI branch. 7. SVG was injected is normal in caliber. JR4 catheter 8. (Superior) Ramus-1 lesion, 80% stenosed. Ramus-2 lesion, 65% stenosed. 9. The left ventricular systolic function is normal. Normal LVEDP   Severe native CAD 3/3 Patent Grafts with diffuse distal disease in grafted  vessels.  The Ramus may very well be the only potential PCI amenable target, but with the length of the diseased segment & vessel diameter - this is not an ideal target.  Would prefer medical management.    Interval History: Since Hospitalization - on Imdur & metoprolol, no further anginal Sx. Is active, but no routine exercise. He has not had any more of the symptoms that lead to his hospitalization.  Does not "push" himself too much, but with routine activity, he denies any anginal chest pain.  He is looking to re-establish Cardiology care locally - has not been seen @ [facility] since 2015. Currently dealing with a Cold - no F/C. ++ Cough.  With this he has noted some DOE, but not otherwise.  No shortness of breath with rest or exertion. No PND, orthopnea or edema.  No palpitations, lightheadedness, dizziness, weakness or syncope/near syncope. No TIA/amaurosis fugax symptoms. No claudication.  Not listed in Med List - he is talking Atorvastatin 80 mg (followed by PCP).  ROS: A comprehensive was performed. Review of Systems  Constitutional: Negative for fever and chills.  HENT: Positive for congestion. Negative for nosebleeds.   Respiratory: Positive for cough. Negative for sputum production and shortness of breath.        Is dealing with a cold.  Cardiovascular:       Per HPI  Gastrointestinal: Negative for heartburn and blood in stool.  Genitourinary: Negative for hematuria.  Musculoskeletal: Positive for joint pain.  Neurological: Positive for headaches (with cold). Negative for dizziness.  Endo/Heme/Allergies: Negative.  Does not bruise/bleed easily.  Psychiatric/Behavioral: Negative.   All other systems reviewed and are negative.   Past Medical History  Diagnosis Date  . Coronary artery disease involving native coronary artery of native heart with angina pectoris (HCC) 07/19/2012;; 10/2014    [facility]) Abnormal Nuclear ST --> Prox LAD ~75% calcified, RI - large & bifurcating w/ 90%  lesion in medial (Diag) territory, Cx- small OM & PLB, RCA 80% mid --> s/p CABG x 3 at [facility] 2014;; b. CATH ([facility]) Patent Grafts, mLAD (after sm D1) 100%, Inf Branch of RI 100%, Superior RI branch (non-grafted) tandem ~80 & 65%, mRCA 100%, Cx mod Dz, dRCA - RPAV severe 90+%.  . 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 Remo Lipps) LIMA-LAD, SVG-OM1 (Inf Branch of RI), SVG-PDA  . Ascending aortic aneurysm (HCC) 07/2012    4.2 cm   . Postoperative atrial fibrillation (HCC) 08/2012    Post-op CABG - Rx wiht Amiodarone; no reported recurrence.  . Hyperlipidemia LDL goal <70     Past Surgical History  Procedure Laterality Date  . Coronary artery bypass graft  2014    3V - LIMA-LAD, SVG-Inferior RI branch (called OM1), SVG-rPDA  . Cardiac catheterization [name] 11/03/2014    Procedure: Left Heart Cath and Cors/Grafts Angiography;  Surgeon: Marykay Lex, MD;  Location: [facility] INVASIVE CV LAB;  Service: Cardiovascular;  mLAD (after sm D1) 100%, OM1/infRI - 100%, SupRI (non-grafted) 80 & 65%, Cx-OM diffuse mode Dz, mRCA 100% & RPAV 95%; Patent LIMA-LAD, SVG-RPDA, SVG-RI(OM)  . Transthoracic echocardiogram  07/2012    [facility]: EF > 55%, Dgen MV Dz w/ mild MR, Ao Sclerosis, Mild AI, Ascending Aorta ~4.0-4.2 cm @ sinotubular Jxn  . Nm myoview ltd  07/2012    ABNORMAL: Mod Size, Mildly Severe Basal Mid & Apical Inferior reversible defect & ++ GXT portion with limiting Angina --> CATH  . Cardiac catheterization  07/2012    [facility]: 75% mLAD (after sm D`), RI - Inf branch 80%, Cx-OM moderate caliber,mRCA 80% & RPAV 95% --> CABG    Prior to Admission medications   Medication Sig Start Date End Date Taking? Authorizing Provider  aspirin EC 81 MG tablet Take 81 mg by mouth daily.   Yes Historical Provider, MD  atorvastatin (LIPITOR) 80 MG tablet Take 80 mg by mouth daily. 07/30/12  Yes Historical Provider, MD  clopidogrel (PLAVIX) 75 MG tablet Take 1 tablet (75 mg total) by mouth daily with breakfast. 11/04/14  Yes Elease Etienne,  MD  fluticasone (FLONASE) 50 MCG/ACT nasal spray Place 2 sprays into both nostrils as needed. 10/15/13  Yes Historical Provider, MD  isosorbide mononitrate (IMDUR) 30 MG 24 hr tablet Take 1 tablet (30 mg total) by mouth daily. 11/04/14  Yes Elease Etienne, MD  levofloxacin (LEVAQUIN) 500 MG tablet Take 1 tablet by mouth daily. 01/19/15  Yes Historical Provider, MD  metoprolol tartrate (LOPRESSOR) 25 MG tablet Take 37.5 mg by mouth 2 (two) times daily. 10/04/14  Yes Historical Provider, MD  nitroGLYCERIN (NITROSTAT) 0.4 MG SL tablet PLACE 1 TABLET UNDER TONGUE EVERY 5 MINS AS NEEDED FOR CHEST PAIN CALL 911/EMS BY 3RD DOSE 10/26/14  Yes Historical Provider, MD   No Known Allergies  Social History   Social History  . Marital Status: Married    Spouse Name: [name]  . Number of Children: [name]  . Years of Education: [name]  Social History Main Topics  . Smoking status: Never Smoker   . Smokeless tobacco: Current User  . Alcohol Use: 1.2 oz/week    2 Cans of beer per week     Comment: history of heavy ETOH abuse, 1/2 gallon of whiskey every 2 days  . Drug Use: No  . Sexual Activity: Not Asked   Other Topics Concern  . None   Social History Narrative   Family History  Problem Relation Age of Onset  . Stroke Father     Wt Readings from Last 3 Encounters:  01/21/15 213 lb 1.6 oz (96.662 kg)  11/03/14 210 lb (95.255 kg)    PHYSICAL EXAM BP 124/82 mmHg  Pulse 65  Ht 5\' 10"  (1.778 m)  Wt 213 lb 1.6 oz (96.662 kg)  BMI 30.58 kg/m2 General appearance: alert, cooperative, appears stated age, no distress and borderline obese HEENT: [place]/AT, EOMI, MMM, anicteric sclera Neck: no adenopathy, no carotid bruit and no JVD Lungs: clear to auscultation bilaterally, normal percussion bilaterally and non-labored Heart: regular rate and rhythm, S1 &, S2 normal, no murmur, click, rub or gallop Abdomen: soft, non-tender; bowel sounds normal; no masses,  no organomegaly; No HJR Extremities: extremities  normal, atraumatic, no cyanosis, and edema  Pulses: 2+ and symmetric;  Skin: normal and mobility and turgor normal  Neurologic: Mental status: Alert, oriented, thought content appropriate Cranial nerves: normal (II-XII grossly intact)    Adult ECG Report  Rate: 65 ;  Rhythm: normal sinus rhythm and minimal non-specific ST-T segment abnormalities (read as computer as ST Elevation).  Normal Axia, intervals & durations;   Narrative Interpretation: Essentially normal EKG - read incorrectly by computer   Other studies Reviewed: Additional studies/ records that were reviewed today include:  Recent Labs:  [name]     ASSESSMENT / PLAN: Problem List Items Addressed This Visit    Unstable angina (HCC)    No further Sx of Angina since increasing BB dose & adding Imdur. Continue current Rx dose for now.      Relevant Medications   atorvastatin (LIPITOR) 80 MG tablet   Other Relevant Orders   EKG 12-Lead   Postoperative atrial fibrillation (HCC)    No recurrence since CABG & having been weaned off of Amiodarone. Monitor - for now will not recommend Anticoagulation unless there is documented recurrence. Continue ASA/Plavix.      Relevant Medications   atorvastatin (LIPITOR) 80 MG tablet   Other Relevant Orders   EKG 12-Lead   Hyperlipidemia LDL goal <70 (Chronic)    Monitored by PCP - on high dose Atorvastatin -- no myalgias Labs not available.      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   On stable BB dose along with Imdur.   On ASA/Plavix & High Dose Statin  No change to regimen -- the other RI branch is potential (but not favorable) PCI target if Sx recur.      Relevant Medications   atorvastatin (LIPITOR) 80 MG tablet   Other Relevant Orders   EKG 12-Lead      Current  medicines are reviewed at length with the patient today. (+/- concerns) What type of Cold medications to take The following changes have been made: OK to take medications without D (for decongestant = ephedrine, pseudoephedrine,  psuedophed, etc).  NO CHANGE WITH CURRENT MEDICATIONS   Your physician wants you to follow-up in  6 MONTHS WITH DR HARDING.  Studies Ordered:   Orders Placed This Encounter  Procedures  . EKG 12-Lead      Marykay Lex, M.D., M.S. Interventional Cardiologist   Pager # [phone]

## 2015-01-21 NOTE — Patient Instructions (Signed)
NO CHANGE WITH CURRENT MEDICATIONS   Your physician wants you to follow-up in 6 MONTHS WITH DR HARDING.  You will receive a reminder letter in the mail two months in advance. If you don't receive a letter, please call our office to schedule the follow-up appointment.  If you need a refill on your cardiac medications before your next appointment, please call your pharmacy.   

## 2015-01-25 ENCOUNTER — Encounter: Payer: Self-pay | Admitting: Cardiology

## 2015-01-25 DIAGNOSIS — E785 Hyperlipidemia, unspecified: Secondary | ICD-10-CM | POA: Insufficient documentation

## 2015-01-25 NOTE — Assessment & Plan Note (Signed)
No further Anginal Sx since cath in 2016.   On stable BB dose along with Imdur.   On ASA/Plavix & High Dose Statin  No change to regimen -- the other RI branch is potential (but not favorable) PCI target if Sx recur.

## 2015-01-25 NOTE — Progress Notes (Signed)
     Review of [facility] Reports:  Cardiolite 07/30/2102 Impression: 1. Abnormal myocardial perfusion study. 2. There is a moderately sized, mildly severe basal mid and apical  inferior completely reversible defect suggestive of ischemia. 3. Patient had chest pain limiting exercise with EKG changes and poor  exercise tolerance.    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 It trifurcates into the left anterior descending (LAD), ramus intermedius, and left circumflex (LCx) arteries. There is 20% distal taper in the LMCA.  The ramus intermedius is a large, bifurcating vessel with 90% stenosis in the more medial branch which essentially supplies diagonal territory.  The LAD is a large-caliber vessel that gives off one small diagonal (D) branch at mid segment before it wraps around the apex. The proximal and high mid LAD are calcified and there is 75% stenosis just beyond the takeoff of the first septal perforator. There is mild diffuse disease of the mid and distal LAD.  The LCx is a medium-caliber vessel that gives rise to a single relatively small caliber obtuse marginal. There is approximately 30% stenosis in the proximal circumflex.  The right coronary artery is a large vessel with a shepherd's crook configuration proximally. There is 80% mid vessel stenosis. The posterolateral RCA has two 95% lesions. One of the 3 terminal branches of the posterolateral is a large vessel supplying the inferoposterior wall.  COMPLICATIONS: No  complications   SUMMARY FINDINGS:  Severe and diffuse three vessel coronary artery disease (as detailed above). Normal left ventricular end-diastolic filling pressures.  PLAN/DISPOSITION AND FOLLOWUP:  CT surgery consultation for CABG.   Marykay LexHARDING, Lochlan Grygiel W, MD

## 2015-01-25 NOTE — Assessment & Plan Note (Signed)
No further Sx of Angina since increasing BB dose & adding Imdur. Continue current Rx dose for now.

## 2015-01-25 NOTE — Assessment & Plan Note (Signed)
Monitored by PCP - on high dose Atorvastatin -- no myalgias Labs not available.

## 2015-01-25 NOTE — Assessment & Plan Note (Signed)
No recurrence since CABG & having been weaned off of Amiodarone. Monitor - for now will not recommend Anticoagulation unless there is documented recurrence. Continue ASA/Plavix.

## 2015-04-23 ENCOUNTER — Encounter: Payer: Self-pay | Admitting: Cardiology

## 2015-04-27 ENCOUNTER — Telehealth: Payer: Self-pay | Admitting: *Deleted

## 2015-04-27 NOTE — Telephone Encounter (Signed)
-----   Message from Marykay Lexavid W Harding, MD sent at 04/26/2015  9:43 PM EDT ----- Recent Labs: From PCP 04/22/2015 Na+ 143, K+ 4.1, Cl- 103, HCO3- 25 , BUN 13, Cr 1.09, Glu 98, Ca2+ 9.2; AST 30, ALT 42 AlkP 57, Alb 4.3, TP 6.8, T Bili 0.8 CBC: W 5.6, H/H 15.2/44.6; Plt 161 TC 115, TG 141, HDL 30, LDL 57  Labs look good.  Marykay LexHARDING, DAVID W, MD   Pls forward to Lonie PeakNathan Conroy, [place]

## 2015-04-27 NOTE — Telephone Encounter (Signed)
LEFT MESSAGE TO CALL BACK - IN REGARDS TO LAB RECEIVED FROM [facility],PER DR HARDING.

## 2015-04-29 NOTE — Telephone Encounter (Signed)
Pt is calling back in regards to some labs. Please f/u with him

## 2015-04-29 NOTE — Telephone Encounter (Signed)
Spoke to patient. Result given . Verbalized understanding  

## 2015-05-27 ENCOUNTER — Other Ambulatory Visit (INDEPENDENT_AMBULATORY_CARE_PROVIDER_SITE_OTHER): Payer: BC Managed Care – PPO

## 2015-05-27 ENCOUNTER — Ambulatory Visit (INDEPENDENT_AMBULATORY_CARE_PROVIDER_SITE_OTHER): Payer: BC Managed Care – PPO | Admitting: Internal Medicine

## 2015-05-27 ENCOUNTER — Telehealth: Payer: Self-pay | Admitting: Internal Medicine

## 2015-05-27 ENCOUNTER — Ambulatory Visit (INDEPENDENT_AMBULATORY_CARE_PROVIDER_SITE_OTHER)
Admission: RE | Admit: 2015-05-27 | Discharge: 2015-05-27 | Disposition: A | Discharge status: Home / Self Care | Payer: BC Managed Care – PPO | Source: Ambulatory Visit | Attending: Internal Medicine | Admitting: Internal Medicine

## 2015-05-27 ENCOUNTER — Encounter: Payer: Self-pay | Admitting: Internal Medicine

## 2015-05-27 VITALS — BP 110/78 | HR 46 | Ht 70.0 in | Wt 206.0 lb

## 2015-05-27 DIAGNOSIS — R0600 Dyspnea, unspecified: Secondary | ICD-10-CM | POA: Diagnosis not present

## 2015-05-27 DIAGNOSIS — R05 Cough: Secondary | ICD-10-CM | POA: Diagnosis not present

## 2015-05-27 DIAGNOSIS — R058 Other specified cough: Secondary | ICD-10-CM | POA: Insufficient documentation

## 2015-05-27 LAB — CBC WITH DIFFERENTIAL/PLATELET
BASOS ABS: 0 10*3/uL (ref 0.0–0.1)
Basophils Relative: 0.3 % (ref 0.0–3.0)
EOS ABS: 0.4 10*3/uL (ref 0.0–0.7)
Eosinophils Relative: 6.9 % — ABNORMAL HIGH (ref 0.0–5.0)
HCT: 40.2 % (ref 39.0–52.0)
Hemoglobin: 13.7 g/dL (ref 13.0–17.0)
LYMPHS ABS: 1.6 10*3/uL (ref 0.7–4.0)
LYMPHS PCT: 25.4 % (ref 12.0–46.0)
MCHC: 34.2 g/dL (ref 30.0–36.0)
MCV: 91.9 fl (ref 78.0–100.0)
MONOS PCT: 8.4 % (ref 3.0–12.0)
Monocytes Absolute: 0.5 10*3/uL (ref 0.1–1.0)
NEUTROS PCT: 59 % (ref 43.0–77.0)
Neutro Abs: 3.8 10*3/uL (ref 1.4–7.7)
Platelets: 199 10*3/uL (ref 150.0–400.0)
RBC: 4.37 Mil/uL (ref 4.22–5.81)
RDW: 12.7 % (ref 11.5–15.5)
WBC: 6.4 10*3/uL (ref 4.0–10.5)

## 2015-05-27 LAB — BASIC METABOLIC PANEL
BUN: 18 mg/dL (ref 6–23)
CALCIUM: 9.2 mg/dL (ref 8.4–10.5)
CO2: 27 mEq/L (ref 19–32)
CREATININE: 1.07 mg/dL (ref 0.40–1.50)
Chloride: 107 mEq/L (ref 96–112)
GFR: 75.57 mL/min (ref >60.00)
GLUCOSE: 103 mg/dL — AB (ref 70–99)
Potassium: 4.2 mEq/L (ref 3.5–5.1)
SODIUM: 141 meq/L (ref 135–145)

## 2015-05-27 LAB — BRAIN NATRIURETIC PEPTIDE: Pro B Natriuretic peptide (BNP): 46 pg/mL (ref 0.0–100.0)

## 2015-05-27 LAB — TSH: TSH: 1.19 u[IU]/mL (ref 0.35–4.50)

## 2015-05-27 MED ORDER — FAMOTIDINE 20 MG PO TABS: ORAL_TABLET | ORAL | Status: DC

## 2015-05-27 MED ORDER — PANTOPRAZOLE SODIUM 40 MG PO TBEC: 40.0000 mg | DELAYED_RELEASE_TABLET | Freq: Every day | ORAL | Status: DC

## 2015-05-27 NOTE — Patient Instructions (Addendum)
Pantoprazole (protonix) 40 mg   Take  30-60 min before first meal of the day and Pepcid (famotidine)  20 mg one @  bedtime until return to office - this is the best way to tell whether stomach acid is contributing to your problem.    GERD (REFLUX)  is an extremely common cause of respiratory symptoms just like yours , many times with no obvious heartburn at all.    It can be treated with medication, but also with lifestyle changes including elevation of the head of your bed (ideally with 6 inch  bed blocks),  Smoking cessation, avoidance of late meals, excessive alcohol, and avoid fatty foods, chocolate, peppermint, colas, red wine, and acidic juices such as orange juice.  NO MINT OR MENTHOL PRODUCTS SO NO COUGH DROPS  USE SUGARLESS CANDY INSTEAD (Jolley ranchers or Stover's or Life Savers) or even ice chips will also do - the key is to swallow to prevent all throat clearing. NO OIL BASED VITAMINS - use powdered substitutes.    To get the most out of exercise, you need to be continuously aware that you are short of breath, but never out of breath,  Building up to 30 minutes dailybut you must pace yourself better.  As you improve, it will actually be easier for you to do the same amount of exercise  in  30 minutes so always push to the level where you are short of breath.    Please remember to go to the lab and x-ray department downstairs for your tests - we will call you with the results when they are available.     Please schedule a follow up office visit in 6 weeks, call sooner if needed with pfts on return, ok to push back if scheduling both on same day is the issue. - add ? Sinus Ct / methacholine next steps if not better - needs f/u cxr unless we can access the cxr from recent reported pna

## 2015-05-27 NOTE — Progress Notes (Signed)
Subjective:    Patient ID: Edgar Brooks, male    DOB: 12-Feb-1955,     MRN: [id number]  HPI  [age] yowm never smoker never any resp problems with variable coughing x 2013 and worse breathing x 2016 referred to pulmonary clinic 05/27/2015 by Lonie PeakNathan Conroy, PA   05/27/2015 1st [facility] office visit/ Edgar Brooks   Chief Complaint  Patient presents with  . Pulmonary Consult    Referred by Dr Lonie PeakNathan Conroy. Pt c/o SOB and cough off and on x 2 yrs- worse x 6 months. He states that he was dxed with PNA x 2 already this year, last in April.  He states he is SOB "all the time"- with or without any exertion. Cough is non prod.   indolent onset progressive doe x "one or two years esp last 6 months much worse with episodes of "pna" Jan 2017 and again in April 2017 assoc with fever which resolved  Cough is worst 30 min p stirring/ no noct flares of cough or sob    No better with saba Sob not proportionate to activity  Assoc with over HB  No obvious other patterns in day to day or daytime variabilty or assoc excess/ purulent sputum or mucus plugs  or cp or chest tightness, subjective wheeze overt sinus  symptoms. No unusual exp hx or h/o childhood pna/ asthma or knowledge of premature birth.  Sleeping ok without nocturnal  or early am exacerbation  of respiratory  c/o's or need for noct saba. Also denies any obvious fluctuation of symptoms with weather or environmental changes or other aggravating or alleviating factors except as outlined above   Current Medications, Allergies, Complete Past Medical History, Past Surgical History, Family History, and Social History were reviewed in [facility] electronic medical record.         Review of Systems  Constitutional: Negative for fever, chills, activity change, appetite change and unexpected weight change.  HENT: Negative for congestion, dental problem, postnasal drip, rhinorrhea, sneezing, sore throat, trouble swallowing and voice change.     Eyes: Negative for visual disturbance.  Respiratory: Positive for cough and shortness of breath. Negative for choking.   Cardiovascular: Negative for chest pain and leg swelling.  Gastrointestinal: Negative for nausea, vomiting and abdominal pain.  Genitourinary: Negative for difficulty urinating.       Acid heartburn  Indigestion  Musculoskeletal: Positive for arthralgias.  Skin: Negative for rash.  Psychiatric/Behavioral: Negative for behavioral problems and confusion.       Objective:   Physical Exam  amb min obese pleasant wm nad  Wt Readings from Last 3 Encounters:  05/27/15 206 lb (93.441 kg)  01/21/15 213 lb 1.6 oz (96.662 kg)  11/03/14 210 lb (95.255 kg)    Vital signs reviewed  HEENT: nl dentition, turbinates, and oropharynx. Nl external ear canals without cough reflex   NECK :  without JVD/Nodes/TM/ nl carotid upstrokes bilaterally   LUNGS: no acc muscle use,  Nl contour chest which is clear to A and P bilaterally without cough on insp or exp maneuvers   CV:  RRR  no s3 or murmur or increase in P2, no edema   ABD:  soft and nontender with nl inspiratory excursion in the supine position. No bruits or organomegaly, bowel sounds nl  MS:  Nl gait/ ext warm without deformities, calf tenderness, cyanosis or clubbing No obvious joint restrictions   SKIN: warm and dry without lesions    NEURO:  alert, approp, nl sensorium  with  no motor deficits     CXR PA and Lateral:   05/27/2015 :    I personally reviewed images and agree with radiology impression as follows:   Pleural thickening and subsegmental atelectasis in the region of the minor fissure. There is no alveolar pneumonia, CHF, nor other acute cardiopulmonary abnormality.   Labs ordered/ reviewed:      Chemistry      Component Value Date/Time   NA 141 05/27/2015 1036   K 4.2 05/27/2015 1036   CL 107 05/27/2015 1036   CO2 27 05/27/2015 1036   BUN 18 05/27/2015 1036   CREATININE 1.07 05/27/2015  1036      Component Value Date/Time   CALCIUM 9.2 05/27/2015 1036   ALKPHOS 102 11/04/2014 0227   AST 232* 11/04/2014 0227   ALT 470* 11/04/2014 0227   BILITOT 1.8* 11/04/2014 0227        Lab Results  Component Value Date   WBC 6.4 05/27/2015   HGB 13.7 05/27/2015   HCT 40.2 05/27/2015   MCV 91.9 05/27/2015   PLT 199.0 05/27/2015         Lab Results  Component Value Date   TSH 1.19 05/27/2015     Lab Results  Component Value Date   PROBNP 46.0 05/27/2015              Assessment & Plan:

## 2015-05-27 NOTE — Progress Notes (Signed)
Quick Note:  LMTCB ______ 

## 2015-05-27 NOTE — Telephone Encounter (Signed)
Spoke with patient's wife-DPR on file-aware of results and nothing more needed at this time.

## 2015-05-28 LAB — RESPIRATORY ALLERGY PROFILE REGION II ~~LOC~~
Allergen, D pternoyssinus,d7: <0.1 kU/L
Alternaria Alternata: <0.1 kU/L
Aspergillus fumigatus, m3: <0.1 kU/L
Box Elder IgE: <0.1 kU/L
Cockroach: <0.1 kU/L
Common Ragweed: <0.1 kU/L
D. farinae: <0.1 kU/L
IGE (IMMUNOGLOBULIN E), SERUM: 31 kU/L (ref <115)
Johnson Grass: <0.1 kU/L
Pecan/Hickory Tree IgE: <0.1 kU/L
Penicillium Notatum: <0.1 kU/L
Rough Pigweed  IgE: <0.1 kU/L
Sheep Sorrel IgE: <0.1 kU/L
Timothy Grass: <0.1 kU/L

## 2015-05-28 NOTE — Assessment & Plan Note (Addendum)
Cough day > noct with sense of urge to clear throat is most c/w  Classic Upper airway cough syndrome, so named because it's frequently impossible to sort out how much is  CR/sinusitis with freq throat clearing (which can be related to primary GERD)   vs  causing  secondary (" extra esophageal")  GERD from wide swings in gastric pressure that occur with throat clearing, often  promoting self use of mint and menthol lozenges that reduce the lower esophageal sphincter tone and exacerbate the problem further in a cyclical fashion.   These are the same pts (now being labeled as having "irritable larynx syndrome" by some cough centers) who not infrequently have a history of having failed to tolerate ace inhibitors,  dry powder inhalers or biphosphonates or report having atypical reflux symptoms that don't respond to standard doses of PPI , and are easily confused as having aecopd or asthma flares by even experienced allergists/ pulmonologists.   rec max rx for gerd then regroup in 6 weeks with ? Sinus Ct / methacholine next steps if not better  The standardized cough guidelines published in Chest by [employer] in 2006 are still the best available and consist of a multiple step process (up to 12!) , not a single office visit,  and are intended  to address this problem logically,  with an alogrithm dependent on response to empiric treatment at  each progressive step  to determine a specific diagnosis with  minimal addtional testing needed. Therefore if adherence is an issue or can't be accurately verified,  it's very unlikely the standard evaluation and treatment will be successful here.    Furthermore, response to therapy (other than acute cough suppression, which should only be used short term with avoidance of narcotic containing cough syrups if possible), can be a gradual process for which the patient is not likely to  perceive immediate benefit.  Unlike going to an eye doctor where the best perscription is  almost always the first one and is immediately effective, this is almost never the case in the management of chronic cough syndromes. Therefore the patient needs to commit up front to consistently adhere to recommendations  for up to 6 weeks of therapy directed at the likely underlying problem(s) before the response can be reasonably evaluated.   Total time devoted to counseling  = 35/6628m review case with pt/ discussion of options/alternatives/ personally creating in presence of pt  then going over specific  Instructions directly with the pt including how to use all of the meds but in particular covering each new medication in detail (see avs)

## 2015-05-28 NOTE — Assessment & Plan Note (Addendum)
05/27/2015  Walked RA x 3 laps @ 185 ft each stopped due to end of study, fast pace, no desat but mild sob at the very end  Symptoms are markedly disproportionate to objective findings and not clear this is a lung problem but pt does appear to have difficult airway management issues.  DDX of  difficult airways management almost all start with A and  include Adherence, Ace Inhibitors, Acid Reflux, Active Sinus Disease, Alpha 1 Antitripsin deficiency, Anxiety masquerading as Airways dz,  ABPA,  Allergy(esp in young), Aspiration (esp in elderly), Adverse effects of meds,  Active smokers, A bunch of PE's (a small clot burden can't cause this syndrome unless there is already severe underlying pulm or vascular dz with poor reserve) plus two Bs  = Bronchiectasis and Beta blocker use..and one C= CHF    Adherence is always the initial "prime suspect" and is a multilayered concern that requires a "trust but verify" approach in every patient - starting with knowing how to use medications, especially inhalers, correctly, keeping up with refills and understanding the fundamental difference between maintenance and prns vs those medications only taken for a very short course and then stopped and not refilled.   ? Acid (or non-acid) GERD > always difficult to exclude as up to 75% of pts in some series report no assoc GI/ Heartburn symptoms> rec max (24h)  acid suppression and diet restrictions/ reviewed and instructions given in writing.   ? Allergy/ asthma > latter unlikely s evidence of noct symptoms but note Eos 0.4 is elevated > continue flonase/ check allergy profile which he needs anyway for cough eval  ? Anxiety, usually dx of exclusion but much higher based on h and p >  Follow up per Primary Care planned    ? A bunch of PE's > very unlikely given ex performance today  ? BB effect > unlikely on such low doses of BB  ? CHF > excluded with such a low bnp today

## 2015-08-03 ENCOUNTER — Ambulatory Visit (INDEPENDENT_AMBULATORY_CARE_PROVIDER_SITE_OTHER)
Admission: RE | Admit: 2015-08-03 | Discharge: 2015-08-03 | Disposition: A | Discharge status: Home / Self Care | Payer: BC Managed Care – PPO | Source: Ambulatory Visit | Attending: Internal Medicine | Admitting: Internal Medicine

## 2015-08-03 ENCOUNTER — Encounter: Payer: Self-pay | Admitting: Internal Medicine

## 2015-08-03 ENCOUNTER — Encounter: Payer: Self-pay | Admitting: Physician Assistant

## 2015-08-03 ENCOUNTER — Other Ambulatory Visit: Payer: Self-pay | Admitting: Internal Medicine

## 2015-08-03 ENCOUNTER — Ambulatory Visit (INDEPENDENT_AMBULATORY_CARE_PROVIDER_SITE_OTHER): Payer: BC Managed Care – PPO | Admitting: Internal Medicine

## 2015-08-03 VITALS — BP 108/74 | HR 59 | Ht 70.5 in | Wt 208.0 lb

## 2015-08-03 DIAGNOSIS — R0600 Dyspnea, unspecified: Secondary | ICD-10-CM | POA: Diagnosis not present

## 2015-08-03 DIAGNOSIS — R05 Cough: Secondary | ICD-10-CM | POA: Diagnosis not present

## 2015-08-03 DIAGNOSIS — K219 Gastro-esophageal reflux disease without esophagitis: Secondary | ICD-10-CM | POA: Insufficient documentation

## 2015-08-03 DIAGNOSIS — R058 Other specified cough: Secondary | ICD-10-CM

## 2015-08-03 LAB — PULMONARY FUNCTION TEST
DL/VA % PRED: 97 %
DL/VA: 4.55 ml/min/mmHg/L
DLCO UNC % PRED: 86 %
DLCO unc: 28.45 ml/min/mmHg
FEF 25-75 POST: 5.66 L/s
FEF 25-75 PRE: 4.75 L/s
FEF2575-%CHANGE-POST: 18 %
FEF2575-%PRED-POST: 180 %
FEF2575-%PRED-PRE: 151 %
FEV1-%Change-Post: 6 %
FEV1-%PRED-POST: 114 %
FEV1-%Pred-Pre: 107 %
FEV1-PRE: 4.04 L
FEV1-Post: 4.28 L
FEV1FVC-%CHANGE-POST: 4 %
FEV1FVC-%PRED-PRE: 112 %
FEV6-%CHANGE-POST: 2 %
FEV6-%PRED-PRE: 98 %
FEV6-%Pred-Post: 101 %
FEV6-Post: 4.78 L
FEV6-Pre: 4.66 L
FEV6FVC-%Change-Post: 0 %
FEV6FVC-%Pred-Post: 103 %
FEV6FVC-%Pred-Pre: 103 %
FVC-%CHANGE-POST: 1 %
FVC-%PRED-PRE: 95 %
FVC-%Pred-Post: 97 %
FVC-POST: 4.79 L
FVC-PRE: 4.71 L
POST FEV1/FVC RATIO: 89 %
PRE FEV1/FVC RATIO: 86 %
Post FEV6/FVC ratio: 100 %
Pre FEV6/FVC Ratio: 99 %
RV % pred: 71 %
RV: 1.59 L
TLC % pred: 87 %
TLC: 6.24 L

## 2015-08-03 MED ORDER — FAMOTIDINE 20 MG PO TABS: ORAL_TABLET | ORAL | Status: DC

## 2015-08-03 NOTE — Assessment & Plan Note (Signed)
05/27/2015  Walked RA x 3 laps @ 185 ft each stopped due to end of study, fast pace, no desat but mild sob at the very end - PFT's  08/03/2015  FEV1 4.28 (114 % ) ratio 89  p 6 % improvement from saba p nothing prior to study with DLCO  86 % corrects to 97  % for alv volume    Unable to shed any light at all on his sob could still be cardiac and I understand w/u is not complete but next step would be cpst with spirometry before and after and only after the issue of ischemia and GERD are adequately addressed (see separate a/p)

## 2015-08-03 NOTE — Progress Notes (Signed)
Quick Note:  lmtcb ______ 

## 2015-08-03 NOTE — Assessment & Plan Note (Signed)
Intol to PPi > Refer to GI 08/03/2015 >>  This could be the pt's main problem and would explain his sob/cough/ recurrent pna if the reflux is severe enough to cause LPR/asp > no need for further pulmonary eval until this is fully addressed

## 2015-08-03 NOTE — Progress Notes (Signed)
Subjective:    Patient ID: Edgar Brooks, male    DOB: 08-06-1955,     MRN: [id number]    Brief patient profile:  [age] yowm never smoker never any resp problems with variable coughing x 2013 and worse breathing even on best days [facility]  referred to pulmonary clinic 05/27/2015 by Lonie PeakNathan Conroy, PA   History of Present Illness  05/27/2015 1st [facility] office visit/ Wert   Chief Complaint  Patient presents with  . Pulmonary Consult    Referred by Dr Lonie PeakNathan Conroy. Pt c/o SOB and cough off and on x 2 yrs- worse x 6 months. He states that he was dxed with PNA x 2 already this year, last in April.  He states he is SOB "all the time"- with or without any exertion. Cough is non prod.   indolent onset progressive doe x "one or two years esp last 6 months much worse with episodes of "pna" Jan 2017 and again in April 2017 assoc with fever which resolved  Cough is worst 30 min p stirring/ no noct flares of cough or sob    No better with saba Sob not proportionate to activity  Assoc with overt HB rec Pantoprazole (protonix) 40 mg   Take  30-60 min before first meal of the day and Pepcid (famotidine)  20 mg one @  bedtime until return to office    GERD diet   To get the most out of exercise, you need to be continuously aware that you are short of breath, but never out of breath,  Building up to 30 minutes dailybut you must pace yourself better.  - add ? Sinus Ct / methacholine next steps if not better    08/03/2015  f/u ov/Wert re: unexplained cough / sob  Chief Complaint  Patient presents with  . Follow-up    Cough and SOB have improved some. He was unable to take pantoprazole due to arthralgia.   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 No unusual exp hx or h/o childhood pna/ asthma or knowledge of premature birth.  Sleeping ok without nocturnal  or early am exacerbation  of respiratory  c/o's or need for noct saba. Also denies any obvious fluctuation of symptoms with weather or environmental changes or other aggravating or alleviating factors except as outlined above   Current Medications, Allergies, Complete Past Medical History, Past Surgical History, Family History, and Social History were reviewed in [facility] electronic medical record.  ROS  The following are not active complaints unless bolded sore throat, dysphagia, dental problems, itching, sneezing,  nasal congestion or excess/ purulent secretions, ear ache,   fever, chills, sweats, unintended wt loss, classically pleuritic or exertional cp,  orthopnea pnd or leg swelling, presyncope, palpitations, abdominal pain, anorexia, nausea, vomiting, diarrhea  or change in bowel or bladder habits, change in stools or urine, dysuria,hematuria,  rash, arthralgias, visual complaints, headache, numbness, weakness or ataxia or problems with walking or coordination,  change in mood/affect or memory.  Objective:   Physical Exam  amb min obese pleasant wm nad   08/03/2015       208   05/27/15 206 lb (93.441 kg)  01/21/15 213 lb 1.6 oz (96.662 kg)  11/03/14 210 lb (95.255 kg)    Vital signs reviewed  HEENT: nl dentition, turbinates, and oropharynx. Nl external ear canals without cough reflex   NECK :  without JVD/Nodes/TM/ nl carotid upstrokes bilaterally   LUNGS: no acc muscle use,  Nl contour chest which is clear to A and P bilaterally without cough on insp or exp maneuvers   CV:  RRR  no s3 or murmur or increase in P2, no edema   ABD:  soft and nontender with nl inspiratory  excursion in the supine position. No bruits or organomegaly, bowel sounds nl  MS:  Nl gait/ ext warm without deformities, calf tenderness, cyanosis or clubbing No obvious joint restrictions   SKIN: warm and dry without lesions    NEURO:  alert, approp, nl sensorium with  no motor deficits     CXR PA and Lateral:   05/27/2015 :    I personally reviewed images and agree with radiology impression as follows:   Pleural thickening and subsegmental atelectasis in the region of the minor fissure. There is no alveolar pneumonia, CHF, nor other acute cardiopulmonary abnormality.   Labs ordered/ reviewed:      Chemistry      Component Value Date/Time   NA 141 05/27/2015 1036   K 4.2 05/27/2015 1036   CL 107 05/27/2015 1036   CO2 27 05/27/2015 1036   BUN 18 05/27/2015 1036   CREATININE 1.07 05/27/2015 1036      Component Value Date/Time   CALCIUM 9.2 05/27/2015 1036   ALKPHOS 102 11/04/2014 0227   AST 232* 11/04/2014 0227   ALT 470* 11/04/2014 0227   BILITOT 1.8* 11/04/2014 0227        Lab Results  Component Value Date   WBC 6.4 05/27/2015   HGB 13.7 05/27/2015   HCT 40.2 05/27/2015   MCV 91.9 05/27/2015   PLT 199.0 05/27/2015         Lab Results  Component Value Date   TSH 1.19 05/27/2015     Lab Results  Component Value Date   PROBNP 46.0 05/27/2015       CXR PA and Lateral:   08/03/2015 :    I personally reviewed images and agree with radiology impression as follows:    No active cardiopulmonary disease. Stable post CABG changes.     Assessment & Plan:

## 2015-08-03 NOTE — Progress Notes (Signed)
PFT done today. 

## 2015-08-03 NOTE — Patient Instructions (Signed)
Stop relafen to see your heartburn is better  Increase the pepcid to 20 mg after bfast and supper   Let us know if the list is not correct  GERD (REFLUX)  is an extremely common cause of respiratory symptoms just like yours , many times with no obvious heartburn at all.    It can be treated with medication, but also with lifestyle changes including elevation of the head of your bed (ideally with 6 inch  bed blocks),  No tobacco products,  avoidance of late meals, excessive alcohol, and avoid fatty foods, chocolate, peppermint, colas, red wine, and acidic juices such as orange juice.  NO MINT OR MENTHOL PRODUCTS SO NO COUGH DROPS  USE SUGARLESS CANDY INSTEAD (Jolley ranchers or Stover's or Life Savers) or even ice chips will also do - the key is to swallow to prevent all throat clearing. NO OIL BASED VITAMINS - use powdered substitutes.    Please see patient coordinator before you leave today  to schedule GI eval for bad hearburn  Please remember to go to the  x-ray department downstairs for your tests - we will call you with the results when they are available.

## 2015-08-03 NOTE — Assessment & Plan Note (Signed)
Allergy profile 05/27/2015 >  Eos 0.4/  IgE  31 neg RAST  No evidence of allergy or asthma > continue max rx for gerd /diet including avoiding all tobacco products/ reviewed   I had an extended discussion with the patient reviewing all relevant studies completed to date and  lasting 15 to 20 minutes of a 25 minute visit    Each maintenance medication was reviewed in detail including most importantly the difference between maintenance and prns and under what circumstances the prns are to be triggered using an action plan format that is not reflected in the computer generated alphabetically organized AVS.    Please see instructions for details which were reviewed in writing and the patient given a copy highlighting the part that I personally wrote and discussed at today's ov.

## 2015-08-26 ENCOUNTER — Ambulatory Visit: Payer: BC Managed Care – PPO | Admitting: Physician Assistant

## 2015-10-01 ENCOUNTER — Encounter: Payer: Self-pay | Admitting: Internal Medicine

## 2016-05-02 NOTE — Progress Notes (Signed)
Cholesterol levels look great with total cholesterol 112, triglycerides a little high at 191, HDL still a little low at 30 but LDL is excellent at 44.  Continue exercise and continue current dose of statin - we may consider reducing at next appt.  Bryan Lemma, MD

## 2016-05-04 ENCOUNTER — Telehealth: Payer: Self-pay | Admitting: *Deleted

## 2016-05-04 NOTE — Telephone Encounter (Signed)
LEFT DETAILED RESULT MESSAGE - ALSO NEEDS AN  OFFICE APPOINTMENT  TO CALL BACK

## 2016-05-04 NOTE — Telephone Encounter (Signed)
-----   Message from Marykay Lex, MD sent at 05/02/2016  1:02 PM EDT ----- Cholesterol levels look great with total cholesterol 112, triglycerides a little high at 191, HDL still a little low at 30 but LDL is excellent at 44.  Continue exercise and continue current dose of statin - we may consider reducing at next appt.  Bryan Lemma, MD

## 2016-05-09 ENCOUNTER — Encounter: Payer: Self-pay | Admitting: Cardiology

## 2016-05-09 ENCOUNTER — Ambulatory Visit (INDEPENDENT_AMBULATORY_CARE_PROVIDER_SITE_OTHER): Payer: BC Managed Care – PPO | Admitting: Cardiology

## 2016-05-09 DIAGNOSIS — I9789 Other postprocedural complications and disorders of the circulatory system, not elsewhere classified: Secondary | ICD-10-CM

## 2016-05-09 DIAGNOSIS — R5383 Other fatigue: Secondary | ICD-10-CM | POA: Diagnosis not present

## 2016-05-09 DIAGNOSIS — I200 Unstable angina: Secondary | ICD-10-CM

## 2016-05-09 DIAGNOSIS — E785 Hyperlipidemia, unspecified: Secondary | ICD-10-CM | POA: Diagnosis not present

## 2016-05-09 DIAGNOSIS — I25119 Atherosclerotic heart disease of native coronary artery with unspecified angina pectoris: Secondary | ICD-10-CM | POA: Diagnosis not present

## 2016-05-09 DIAGNOSIS — I4891 Unspecified atrial fibrillation: Secondary | ICD-10-CM

## 2016-05-09 MED ORDER — ATORVASTATIN CALCIUM 80 MG PO TABS: 40.0000 mg | ORAL_TABLET | Freq: Every day | ORAL | 3 refills | Status: DC

## 2016-05-09 NOTE — Assessment & Plan Note (Signed)
He did have a significant angina which came to the hospital in 2016. Well-controlled now with Imdur and beta blocker. No change.

## 2016-05-09 NOTE — Progress Notes (Signed)
PCP: Lonie Peak, PA-C  Clinic Note: Chief Complaint  Patient presents with  . Follow-up    12 month; Pt states no Sx.   . Coronary Artery Disease    CABG   HPI: Edgar Brooks is a 61 y.o. male with a PMH below who presents today for Delayed annual follow-up for CAD and mild ascending aortic aneurysm.[name] He was formerly a patient [facility] - where he had CABG in 2014. Most recent catheterization was in October 2016. Interestingly, he is a nonsmoker without diabetes and does not have a significant family history of CAD.  Edgar Brooks was last seen on 01/21/2015 - he was doing well without any further symptoms. He was not "pushing himself". He was reestablishing cardiology care having been lost to follow-up since 2015.  Recent Hospitalizations: None  Studies Reviewed: None  Interval History: [name] presents today for a well visit just over 1 year out from his last visit for CAD. He is trying to stay as active as possible. He doesn't necessarily routine exercise, but he is doing vigorous work as a Curator. He has not had any signs symptoms of recurrent angina with either rest or exertion. He may get a little short of breath when lifting a heavy engine block, but otherwise really doesn't notice any exertional dyspnea. No resting dyspnea. No PND, orthopnea or edema.   No palpitations, lightheadedness, dizziness, weakness or syncope/near syncope. No TIA/amaurosis fugax symptoms. No melena, hematochezia, hematuria, or epstaxis. No claudication.  ROS: A comprehensive was performed. Review of Systems  Constitutional: Negative for chills, fever and malaise/fatigue (He notes that he sometimes feels a little bit fatigue attempted a day with little less get up and go.).  HENT: Negative for ear discharge, nosebleeds and sinus pain.   Respiratory: Negative for cough, shortness of breath and wheezing.   Cardiovascular: Negative for leg swelling.       Per history of present illness    Gastrointestinal: Positive for heartburn. Negative for constipation and diarrhea.  Genitourinary: Negative for hematuria.  Musculoskeletal: Negative for back pain, falls, myalgias and neck pain.  Neurological: Positive for dizziness (Occasionally, not very worrisome. Mild positional). Negative for focal weakness, seizures, loss of consciousness and weakness.  Psychiatric/Behavioral: Negative for depression and memory loss. The patient is not nervous/anxious and does not have insomnia.   All other systems reviewed and are negative.   Past Medical History:  Diagnosis Date  . Ascending aortic aneurysm (HCC) 07/2012   4.2 cm   . Coronary artery disease involving native coronary artery of native heart with angina pectoris (HCC) 07/19/2012;; 10/2014   [facility]) Abnormal Nuclear ST --> Prox LAD ~75% calcified, RI - large & bifurcating w/ 90% lesion in medial (Diag) territory, Cx- small OM & PLB, RCA 80% mid --> s/p CABG x 3 at [facility] 2014;; b. CATH ([facility]) Patent Grafts, mLAD (after sm D1) 100%, Inf Branch of RI 100%, Superior RI branch (non-grafted) tandem ~80 & 65%, mRCA 100%, Cx mod Dz, dRCA - RPAV severe 90+%.  [name] Hyperlipidemia LDL goal <70   . Postoperative atrial fibrillation (HCC) 08/2012   Post-op CABG - Rx wiht Amiodarone; no reported recurrence.  . 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 Remo Lipps) LIMA-LAD, SVG-OM1 (Inf Branch of [place]), SVG-PDA    Past Surgical History:  Procedure Laterality Date  . CARDIAC CATHETERIZATION [name] 11/03/2014   Procedure: Left Heart Cath and Cors/Grafts Angiography;  Surgeon: Marykay Lex, MD;  Location: [facility] INVASIVE CV LAB;  Service: Cardiovascular;  mLAD (after sm D1) 100%, OM1/infRI - 100%, SupRI (non-grafted) 80 & 65%, Cx-OM diffuse mode Dz, mRCA 100% & RPAV 95%; Patent LIMA-LAD, SVG-RPDA, SVG-RI(OM)  . CARDIAC CATHETERIZATION  07/2012   [facility]: 75% mLAD (after sm D`), RI - Inf branch 80%, Cx-OM moderate caliber,mRCA 80% & RPAV 95% --> CABG  . CORONARY ARTERY BYPASS GRAFT   2014   3V - LIMA-LAD, SVG-Inferior RI branch (called OM1), SVG-rPDA  . NM MYOVIEW LTD  07/2012   ABNORMAL: Mod Size, Mildly Severe Basal Mid & Apical Inferior reversible defect & ++ GXT portion with limiting Angina --> CATH  . TRANSTHORACIC ECHOCARDIOGRAM  07/2012   [facility]: EF > 55%, Dgen MV Dz w/ mild MR, Ao Sclerosis, Mild AI, Ascending Aorta ~4.0-4.2 cm @ sinotubular Jxn   Cardiac Cath 10/2014 - 3/3 grafts patent   Current Meds  Medication Sig  . atorvastatin (LIPITOR) 80 MG tablet Take 0.5 tablets (40 mg total) by mouth daily.  . clopidogrel (PLAVIX) 75 MG tablet Take 1 tablet (75 mg total) by mouth daily with breakfast.  . famotidine (PEPCID) 20 MG tablet One after breakfast and supper  . fluticasone (FLONASE) 50 MCG/ACT nasal spray Place 2 sprays into both nostrils as needed. Reported on 08/03/2015  . isosorbide mononitrate (IMDUR) 30 MG 24 hr tablet Take 1 tablet (30 mg total) by mouth daily.  . metoprolol tartrate (LOPRESSOR) 25 MG tablet Take 37.5 mg by mouth 2 (two) times daily.  . nitroGLYCERIN (NITROSTAT) 0.4 MG SL tablet PLACE 1 TABLET UNDER TONGUE EVERY 5 MINS AS NEEDED FOR CHEST PAIN CALL 911/EMS BY 3RD DOSE  . zolpidem (AMBIEN) 10 MG tablet 1/2 at bedtime as needed  . [DISCONTINUED] atorvastatin (LIPITOR) 80 MG tablet Take 80 mg by mouth daily.    Allergies  Allergen Reactions  . Pantoprazole     arthralgia    Social History   Social History  . Marital status: Married    Spouse name: [name]  . Number of children: [name]  . Years of education: [name]   Social History Main Topics  . Smoking status: Never Smoker  . Smokeless tobacco: Current User    Types: Chew  . Alcohol use 1.2 oz/week    2 Cans of beer per week     Comment: history of heavy ETOH abuse, 1/2 gallon of whiskey every 2 days  . Drug use: No  . Sexual activity: Not Asked   Other Topics Concern  . None   Social History Narrative  . None    family history includes Stroke in his father.  Wt Readings  from Last 3 Encounters:  05/09/16 211 lb 3.2 oz (95.8 kg)  08/03/15 208 lb (94.3 kg)  05/27/15 206 lb (93.4 kg)    PHYSICAL EXAM BP 121/75   Pulse 64   Ht  (1.778 m)   Wt 211 lb 3.2 oz (95.8 kg)   BMI 30.30 kg/m  General appearance: alert, cooperative, appears stated age, no distress and Mildly obese and (truncal) - otherwise healthy-appearing HEENT: [place]/AT, EOMI, MMM, anicteric sclera Neck: no adenopathy, no carotid bruit and no JVD Lungs: clear to auscultation bilaterally, normal percussion bilaterally and non-labored Heart: regular rate and rhythm, S1& S2 normal, no murmur, click, rub or gallop ; nondisplaced PMI Abdomen: soft, non-tender; bowel sounds normal; no masses,  no organomegaly; no HJR.  Extremities: extremities normal, atraumatic, no cyanosis, or edema  Pulses: 2+ and symmetric;  Skin: no evidence of bleeding or bruising, no  lesions noted, temperature normal, texture normal and Clinically no sign of ischemia  Neurologic: Mental status: Alert, oriented, thought content appropriate Cranial nerves: normal (II-XII grossly intact)    Adult ECG Report  Rate: 64 ;  Rhythm: normal sinus rhythm and Borderline 1 AVB (200). Possible septal MI, age undetermined.. Otherwise normal axis, intervals and durations.;   Narrative Interpretation: Stable EKG.   Other studies Reviewed: Additional studies/ records that were reviewed today include:  Recent Labs:  - checked by Lonie Peak, PA (last in April 6th) Cholesterol levels look great with total cholesterol 112, triglycerides a little high at 191, HDL still a little low at 30 but LDL is excellent at 44.   ASSESSMENT / PLAN: Problem List Items Addressed This Visit    Atherosclerosis of native coronary artery with angina pectoris (HCC) (Chronic)    Overall pretty significant native disease with widely patent grafts. He could have some angina from the OM1/high ramus branch that has diffuse disease, but this is not PCI target.  Thankfully, he is not having any more symptoms. He continues to be stable on low-dose Imdur and moderate his metoprolol along with Plavix and high-dose statin.      Relevant Medications   atorvastatin (LIPITOR) 80 MG tablet   Other Relevant Orders   EKG 12-Lead   Fatigue due to treatment    I can tell for sure if his fatigue is related medications, however he is on some medications that can control that. For now I want to see how he does with reducing his atorvastatin as this may have some benefit, after one month, if he still noticing some daytime fatigue, but I recommend he do his switch his metoprolol dosing take 25 mg in the morning, and 50 mg the evening.      Hyperlipidemia LDL goal <70 (Chronic)    I reviewed the lipid panel forwarded by PCP - based on these labs, triglycerides are low but high, and we can probably consider adjusting his diet. But the lipid panel looks great and I think he probably reduce him from 80 mg  down to 40 mg of atorvastatin. He should be having repeat labs checked in October timeframe, based on what those look like to determine if we continue with 40 versus go back up to 80 mg      Relevant Medications   atorvastatin (LIPITOR) 80 MG tablet   Postoperative atrial fibrillation (HCC) (Chronic)    So far, no recurrence of A. fib since his bypass surgery. He is on low-dose beta blocker for rate control, but nothing for rhythm control and no sign of recurrence. Continue Plavix.      Relevant Medications   atorvastatin (LIPITOR) 80 MG tablet   Other Relevant Orders   EKG 12-Lead   Unstable angina (HCC) (Chronic)    He did have a significant angina which came to the hospital in 2016. Well-controlled now with Imdur and beta blocker. No change.      Relevant Medications   atorvastatin (LIPITOR) 80 MG tablet   Other Relevant Orders   EKG 12-Lead      Current medicines are reviewed at length with the patient today. (+/- concerns) None The following  changes have been made: None  Patient Instructions  Medication changes  Decrease Atorvastatin to 40 mg ( 1/2 of 80 mg)   If your energy has not improved after a month - decrease Metoprolol  25 mg in the morning and 50 mg in the evening.  You  can call office to let us know of the changes.     Your physician wants you to follow-up in 12 months with Dr Herbie Baltimore. You will receive a reminder letter in the mail two months in advance. If you don't receive a letter, please call our office to schedule the follow-up appointment.   If you need a refill on your cardiac medications before your next appointment, please call your pharmacy.    Studies Ordered:   Orders Placed This Encounter  Procedures  . EKG 12-Lead      Bryan Lemma, M.D., M.S. Interventional Cardiologist   Pager # [phone] Phone # [phone] [address] [place], [place] [address]

## 2016-05-09 NOTE — Assessment & Plan Note (Signed)
I reviewed the lipid panel forwarded by PCP - based on these labs, triglycerides are low but high, and we can probably consider adjusting his diet. But the lipid panel looks great and I think he probably reduce him from 80 mg  down to 40 mg of atorvastatin. He should be having repeat labs checked in October timeframe, based on what those look like to determine if we continue with 40 versus go back up to 80 mg

## 2016-05-09 NOTE — Assessment & Plan Note (Signed)
Overall pretty significant native disease with widely patent grafts. He could have some angina from the OM1/high ramus branch that has diffuse disease, but this is not PCI target. Thankfully, he is not having any more symptoms. He continues to be stable on low-dose Imdur and moderate his metoprolol along with Plavix and high-dose statin.

## 2016-05-09 NOTE — Patient Instructions (Signed)
Medication changes  Decrease Atorvastatin to 40 mg ( 1/2 of 80 mg)   If your energy has not improved after a month - decrease Metoprolol  25 mg in the morning and 50 mg in the evening.  You can call office to let us know of the changes.     Your physician wants you to follow-up in 12 months with Dr Herbie Baltimore. You will receive a reminder letter in the mail two months in advance. If you don't receive a letter, please call our office to schedule the follow-up appointment.   If you need a refill on your cardiac medications before your next appointment, please call your pharmacy.

## 2016-05-09 NOTE — Assessment & Plan Note (Signed)
I can tell for sure if his fatigue is related medications, however he is on some medications that can control that. For now I want to see how he does with reducing his atorvastatin as this may have some benefit, after one month, if he still noticing some daytime fatigue, but I recommend he do his switch his metoprolol dosing take 25 mg in the morning, and 50 mg the evening.

## 2016-05-09 NOTE — Assessment & Plan Note (Signed)
So far, no recurrence of A. fib since his bypass surgery. He is on low-dose beta blocker for rate control, but nothing for rhythm control and no sign of recurrence. Continue Plavix.

## 2016-06-13 ENCOUNTER — Telehealth: Payer: Self-pay | Admitting: Cardiology

## 2016-06-13 NOTE — Telephone Encounter (Signed)
Edgar Brooks is returning your call , Please call;  Thanks

## 2016-06-13 NOTE — Telephone Encounter (Signed)
Left message for pt wife to call  

## 2016-06-13 NOTE — Telephone Encounter (Signed)
Spoke with pt wife, he went into atrial fib on Saturday per her report. It lasted 15 to 20 min, pulse rate was 65 and bp 121/78. After the episode he felt tired and had a slight headache. No further problems since Saturday.will forward for dr harding's review and advise

## 2016-06-13 NOTE — Telephone Encounter (Signed)
Patient wife states that patient went into AFIB over the weekend but has come out of it. Patient wife would like to know if patient needs to schedule an appt with Dr. Herbie BaltimoreHarding. Thanks.

## 2016-06-14 NOTE — Telephone Encounter (Signed)
I guess we need to confirm that he truly was in Afib & if so discuss potential anticoagulation. Probably should schedule f/u appt.  Bryan Lemmaavid Brynley Cuddeback, MD

## 2016-06-15 NOTE — Telephone Encounter (Signed)
Left message for wife to call the office to schedule an appointment.

## 2016-06-19 ENCOUNTER — Encounter: Payer: Self-pay | Admitting: Cardiology

## 2016-06-19 ENCOUNTER — Ambulatory Visit (INDEPENDENT_AMBULATORY_CARE_PROVIDER_SITE_OTHER): Payer: BC Managed Care – PPO | Admitting: Cardiology

## 2016-06-19 VITALS — BP 112/74 | HR 59 | Ht 70.0 in | Wt 207.2 lb

## 2016-06-19 DIAGNOSIS — I480 Paroxysmal atrial fibrillation: Secondary | ICD-10-CM

## 2016-06-19 DIAGNOSIS — I25119 Atherosclerotic heart disease of native coronary artery with unspecified angina pectoris: Secondary | ICD-10-CM | POA: Diagnosis not present

## 2016-06-19 MED ORDER — RIVAROXABAN 20 MG PO TABS: 20.0000 mg | ORAL_TABLET | Freq: Every day | ORAL | 3 refills | Status: DC

## 2016-06-19 MED ORDER — RIVAROXABAN 20 MG PO TABS: 20.0000 mg | ORAL_TABLET | Freq: Every day | ORAL | 6 refills | Status: DC

## 2016-06-19 MED ORDER — DRONEDARONE HCL 400 MG PO TABS: 400.0000 mg | ORAL_TABLET | Freq: Two times a day (BID) | ORAL | 6 refills | Status: DC

## 2016-06-19 NOTE — Progress Notes (Signed)
PCP: Lonie Peakonroy, Nathan, PA-C  Clinic Note: Chief Complaint  Patient presents with  . Follow-up    pt states feeling dizzy after going into AFIB   . Atrial Fibrillation    HPI: Edgar Brooks is a 61 y.o. male with a PMH below who presents today for work in visit for asymptomatic A. Fib. He had a history of postop A. Fib after CABG, did not have any further recurrence.Edgar Bast.  Edgar Brooks was last seen on 05/09/2016 - he noted some mild exertional dyspnea  Recent Hospitalizations: none  Studies Personally Reviewed - (if available, images/films reviewed: From Epic Chart or Care Everywhere)  None  Interval History: Edgar Brooks presents today concerned about a ~20-30 minute episode of what felt like the Afib he had in the post-op setting.  His HR was not overly fast, but faster than usual and very irregular.  It did not last long enough to try to capture the rhythm.  He felt an uncomfortable feeling in his chest - like a fluttering sensation & asked his wife (as well as a friend that is a Charity fundraiserN) to feel his pulse.  He felt a bit lightheaded & SOB, but denied any CP similar to his angina. He has otherwise been doing very well with no notable complaints of CHF or Angina sx. No resting or exertional CP or dsypnea. No PND, orthopnea or edema.  No weakness or syncope/near syncope. No TIA/amaurosis fugax symptoms. No claudication.  ROS: A comprehensive was performed. Review of Systems  Constitutional: Negative for malaise/fatigue.  HENT: Negative for congestion and nosebleeds.   Respiratory: Negative for shortness of breath.   Gastrointestinal: Negative for blood in stool.  Genitourinary: Negative for hematuria.  Musculoskeletal: Negative for falls and joint pain.  Neurological: Negative for dizziness.  Psychiatric/Behavioral: Negative for memory loss. The patient is nervous/anxious (b/c of this "Afib episode"). The patient does not have insomnia.   All other systems reviewed and are  negative.   I have reviewed and (if needed) personally updated the patient's problem list, medications, allergies, past medical and surgical history, social and family history.   Past Medical History:  Diagnosis Date  . Ascending aortic aneurysm (HCC) 07/2012   4.2 cm   . Coronary artery disease involving native coronary artery of native heart with angina pectoris (HCC) 07/19/2012;; 10/2014   [facility]) Ab[facility] Nuclear ST --> Prox LAD ~75% calcified, RI - large & bifurcating w/ 90% lesion in medial (Diag) territory, Cx- small OM & PLB, RCA 80% mid --> [facility];; b. CATH ([facility]) Patent Grafts, mLAD (after sm D1) 100%, Inf Branch of RI 100%, Superior RI branch (non-grafted) tandem ~80 & 65%, mRCA 100%, Cx mod Dz, dRCA - RPAV severe 90+%.  [name]. Hyperlipidemia LDL goal <70   . Postoperative atrial fibrillation (HCC) 08/2012   Post-op CABG - Rx wiht Amiodarone; no reported recurrence.  . 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 Remo Lipp[place]idan) LIMA-LAD, SVG-OM1 (Inf [place]), SVG-PDA    Past Surgical History:  Procedure Laterality Date  . CARDIAC CATHETERIZATION [name] 11/03/2014   Procedure: Left Heart Cath and Cors/Grafts Angiography;  Surgeon: Marykay Lexavid W Harding, MD;  [facility] INVASIVE CV LAB;  Service: Cardiovascular;  mLAD (after sm D1) 100%, OM1/infRI - 100%, SupRI (non-grafted) 80 & 65%, Cx-OM diffuse mode Dz, mRCA 100% & RPAV 95%; Patent LIMA-LAD, SVG-RPDA, SVG-RI(OM)  . CARDIAC CATHETERIZATION  07/2012   [facility]: 75% mLAD (after sm D`), RI -  Inf branch 80%, Cx-OM moderate caliber,mRCA 80% & RPAV 95% --> CABG  . CORONARY ARTERY BYPASS GRAFT  2014   3V - LIMA-LAD, SVG-Inferior RI branch (called OM1), SVG-rPDA  . NM MYOVIEW LTD  07/2012   ABNORMAL: Mod Size, Mildly Severe Basal Mid & Apical Inferior reversible defect & ++ GXT portion with limiting Angina --> CATH  . TRANSTHORACIC ECHOCARDIOGRAM  07/2012   [facility]: EF > 55%, Dgen MV Dz w/ mild MR, Ao Sclerosis, Mild AI, Ascending Aorta ~4.0-4.2 cm @  sinotubular Jxn   Cardiac Cath 10/2014 - 3/3 grafts patent    Current Meds  Medication Sig  . atorvastatin (LIPITOR) 80 MG tablet Take 0.5 tablets (40 mg total) by mouth daily.  . famotidine (PEPCID) 20 MG tablet One after breakfast and supper  . fluticasone (FLONASE) 50 MCG/ACT nasal spray Place 2 sprays into both nostrils as needed. Reported on 08/03/2015  . isosorbide mononitrate (IMDUR) 30 MG 24 hr tablet Take 1 tablet (30 mg total) by mouth daily.  . metoprolol tartrate (LOPRESSOR) 25 MG tablet Take 37.5 mg by mouth 2 (two) times daily.  . nitroGLYCERIN (NITROSTAT) 0.4 MG SL tablet PLACE 1 TABLET UNDER TONGUE EVERY 5 MINS AS NEEDED FOR CHEST PAIN CALL 911/EMS BY 3RD DOSE  . zolpidem (AMBIEN) 10 MG tablet 1/2 at bedtime as needed  . [DISCONTINUED] clopidogrel (PLAVIX) 75 MG tablet Take 1 tablet (75 mg total) by mouth daily with breakfast.    Allergies  Allergen Reactions  . Pantoprazole     arthralgia    Social History   Social History  . Marital status: Married    Spouse name: [name]  . Number of children: [name]  . Years of education: [name]   Social History Main Topics  . Smoking status: Never Smoker  . Smokeless tobacco: Current User    Types: Chew  . Alcohol use 1.2 oz/week    2 Cans of beer per week     Comment: history of heavy ETOH abuse, 1/2 gallon of whiskey every 2 days  . Drug use: No  . Sexual activity: Not Asked   Other Topics Concern  . None   Social History Narrative  . None    family history includes Stroke in his father.  Wt Readings from Last 3 Encounters:  06/19/16 207 lb 3.2 oz (94 kg)  05/09/16 211 lb 3.2 oz (95.8 kg)  08/03/15 208 lb (94.3 kg)    PHYSICAL EXAM BP 112/74   Pulse (!) 59   Ht 5\' 10"  (1.778 m)   Wt 207 lb 3.2 oz (94 kg)   BMI 29.73 kg/m  General appearance: alert, cooperative, appears stated age, no distress. Borderline obese HEENT: [place]/AT, EOMI, MMM, anicteric sclera Neck: no adenopathy, no carotid bruit and no  JVD Lungs: Brooks to auscultation bilaterally, normal percussion bilaterally and non-labored Heart: RRR, S1 &S2 normal, no murmur, click, rub or gallop; non-displaced PMI Abdomen: soft, non-tender; bowel sounds normal; no masses,  no organomegaly; no HJR Extremities: extremities normal, atraumatic, no cyanosis, or edema Pulses: 2+ and symmetric;  Skin: mobility and turgor normal Neurologic: Mental status: Alert & oriented x 3, thought content appropriate; non-focal exam.  Pleasant mood & affect.    Adult ECG Report  Rate: 59 ;  Rhythm: sinus bradycardia and 1 AVB; cannot exclude anterior MI, age undetermined  Narrative Interpretation: stable    Other studies Reviewed: Additional studies/ records that were reviewed today include:  Recent Labs:   n/a  ASSESSMENT / PLAN:  Problem List Items Addressed This Visit    Atherosclerosis of native coronary artery with angina pectoris (HCC) (Chronic)    Significant CAD with patent grafts.  Remains stable without any recurrent Angina.  = Continue Imdue & Metoprolol + Statin D/c Plavix as we are starting Xarelto      Relevant Medications   dronedarone (MULTAQ) 400 MG tablet   rivaroxaban (XARELTO) 20 MG TABS tablet   PAF (paroxysmal atrial fibrillation) (HCC) - Primary (Chronic)    He is pretty sure that this episodes was Afib - cannot be sure as we did not get an EKG.  He has had some Afib since surgery & this felt just like it.  Short-lived, but he is concerned that he will be out in [place] for most of the end of June into July.  Plan: He is on BB - for rate control.  Will start Multaq 400 mg BID fo rhythm control (with CAD - Class 1 agents contraindicated).  Stop Plavix & start Xarelto for anticoagulation  This patients CHA2DS2-VASc Score and unadjusted Ischemic Stroke Rate (% per year) is equal to 2.2 % stroke rate/year from a score of 2  Above score calculated as 1 point each if present [CHF, HTN, DM, Vascular=MI/PAD/Aortic  Plaque, Age if 65-74, or Male] Above score calculated as 2 points each if present [Age > 75, or Stroke/TIA/TE]        Relevant Medications   dronedarone (MULTAQ) 400 MG tablet   rivaroxaban (XARELTO) 20 MG TABS tablet   Other Relevant Orders   EKG 12-Lead (Completed)      ~25-30 min spent in direct discussion with the patient and family discussing A. fib and various treatment options. > 50% of the time was spent in direct patient counseling.  Ok to travel to [place].  Current medicines are reviewed at length with the patient today. (+/- concerns) n/a The following changes have been made: Add Multaq  Patient Instructions  MEDICATION  STOP CLOPIDOGREL   START  XARELTO 20 MG   ONE TABLET DAILY AT SUPPER TIME ( OR HEAVIEST MEAL OF THE DAY)   START MULTAQ 400 MG TAKE ONE TAKE TWICE A DAY   IF HEART RATE STAYS IN THE LOW 50's ( lower than 55) decrease metoprolol tartrate ( lopressor)  To 25 mg twice a day ,but If you have a break through you may take an extra dose of metoprolol for that day.    Your physician recommends that you schedule a follow-up appointment in Aug 2018 with dr harding.   If you need a refill on your cardiac medications before your next appointment, please call your pharmacy.     Studies Ordered:   Orders Placed This Encounter  Procedures  . EKG 12-Lead      Bryan Lemma, M.D., M.S. Interventional Cardiologist   Pager # [phone] Phone # [phone] [address] [place], [place] [address]

## 2016-06-19 NOTE — Patient Instructions (Signed)
MEDICATION  STOP CLOPIDOGREL   START  XARELTO 20 MG   ONE TABLET DAILY AT SUPPER TIME ( OR HEAVIEST MEAL OF THE DAY)   START MULTAQ 400 MG TAKE ONE TAKE TWICE A DAY   IF HEART RATE STAYS IN THE LOW 50's ( lower than 55) decrease metoprolol tartrate ( lopressor)  To 25 mg twice a day ,but If you have a break through you may take an extra dose of metoprolol for that day.    Your physician recommends that you schedule a follow-up appointment in Aug 2018 with dr harding.   If you need a refill on your cardiac medications before your next appointment, please call your pharmacy.

## 2016-06-20 ENCOUNTER — Encounter: Payer: Self-pay | Admitting: Cardiology

## 2016-06-20 NOTE — Assessment & Plan Note (Signed)
Significant CAD with patent grafts.  Remains stable without any recurrent Angina.  = Continue Imdue & Metoprolol + Statin D/c Plavix as we are starting Xarelto

## 2016-06-20 NOTE — Assessment & Plan Note (Signed)
He is pretty sure that this episodes was Afib - cannot be sure as we did not get an EKG.  He has had some Afib since surgery & this felt just like it.  Short-lived, but he is concerned that he will be out in [place] for most of the end of June into July.  Plan: He is on BB - for rate control.  Will start Multaq 400 mg BID fo rhythm control (with CAD - Class 1 agents contraindicated).  Stop Plavix & start Xarelto for anticoagulation  This patients CHA2DS2-VASc Score and unadjusted Ischemic Stroke Rate (% per year) is equal to 2.2 % stroke rate/year from a score of 2  Above score calculated as 1 point each if present [CHF, HTN, DM, Vascular=MI/PAD/Aortic Plaque, Age if 65-74, or Male] Above score calculated as 2 points each if present [Age > 75, or Stroke/TIA/TE]

## 2016-06-21 ENCOUNTER — Telehealth: Payer: Self-pay | Admitting: Cardiology

## 2016-06-21 NOTE — Telephone Encounter (Signed)
IF HEART RATE STAYS IN THE LOW 50's ( lower than 55) decrease metoprolol tartrate ( lopressor)  To 25 mg twice a day ,but If you have a break through you may take an extra dose of metoprolol for that day.  He got dizzy when his heart rate dropped & BP was low. He has started taking Lopressor 25mg  BID - advised this appropriate given HR of 48. Reiterated instructions that PRN dose may be taken one time daily. Wife voiced understanding. Med list updated.

## 2016-06-21 NOTE — Telephone Encounter (Signed)
Agree - if HR <50 - cut Metoprolol back to 25 mg BID. & use for PRN.  Bryan Lemmaavid Twana Wileman, MD

## 2016-06-21 NOTE — Telephone Encounter (Signed)
New message     Pt wife called states the pt heart rate was 48 with the Multaq and  metoprolol tartrate (LOPRESSOR) 25 MG tablet Take 37.5 mg by mouth 2 (two) times     how should he adjust the medication ?

## 2016-08-07 ENCOUNTER — Other Ambulatory Visit: Payer: Self-pay | Admitting: Internal Medicine

## 2016-08-21 ENCOUNTER — Encounter: Payer: Self-pay | Admitting: Cardiology

## 2016-08-21 ENCOUNTER — Ambulatory Visit (INDEPENDENT_AMBULATORY_CARE_PROVIDER_SITE_OTHER): Payer: BC Managed Care – PPO | Admitting: Cardiology

## 2016-08-21 VITALS — BP 100/70 | HR 52 | Ht 70.0 in | Wt 208.0 lb

## 2016-08-21 DIAGNOSIS — E785 Hyperlipidemia, unspecified: Secondary | ICD-10-CM

## 2016-08-21 DIAGNOSIS — I480 Paroxysmal atrial fibrillation: Secondary | ICD-10-CM

## 2016-08-21 DIAGNOSIS — I25119 Atherosclerotic heart disease of native coronary artery with unspecified angina pectoris: Secondary | ICD-10-CM | POA: Diagnosis not present

## 2016-08-21 MED ORDER — APIXABAN 5 MG PO TABS: 5.0000 mg | ORAL_TABLET | Freq: Two times a day (BID) | ORAL | 6 refills | Status: DC

## 2016-08-21 NOTE — Progress Notes (Signed)
PCP: Lonie Peak, PA-C  Clinic Note: Chief Complaint  Patient presents with  . Follow-up    having some sides effects of new med-blood mouth in the mornings, painful b/m--Xarelto, Multaq,   . Coronary Artery Disease  . Atrial Fibrillation    HPI: Edgar Brooks is a 61 y.o. male with a PMH below who presents today for Follow-up of A. fib and CAD. History of CAD-CABG with postop A. fib. Now the recurrence in 2018.  Edgar Brooks was last seen in June 2014 for an episode of A. fib. We started him on Multaq and Xarelto stopped Plavix  Recent Hospitalizations: None  Studies Personally Reviewed - (if available, images/films reviewed: From Epic Chart or Care Everywhere)  None  Interval History: Edgar Brooks returns today after his trip out west to [place]. Very enjoyable trip, but he was troubled because ever since she started Xarelto he's been noticing the morning he wakes up with blood in his mouth. He has not had bloody stools or any other bleeding anywhere else. No melena, hematochezia or hematuria or epistaxis. He also notes that he has very painful defecation. He has not noted any hemorrhoidal bleed, but feels as if his hemorrhoids have flared up. He denies any drop in energy or exercise intolerance.  He has not noted any more of the root of fluttering sensations that were consistent suggest A. fib. He denies any rapid irregular heartbeats or palpitations. No syncope/near syncope, TIAs amaurosis fugax.  No chest pain or shortness of breath with rest or exertion. No PND, orthopnea or edema.  No claudication.  ROS: A comprehensive was performed. Pertinent symptoms noted above Review of Systems  Constitutional: Negative for malaise/fatigue.  Respiratory: Negative for cough, shortness of breath and wheezing.   Gastrointestinal: Negative for abdominal pain, constipation and heartburn.  Musculoskeletal: Positive for joint pain. Negative for myalgias.  Neurological: Negative  for dizziness.  Psychiatric/Behavioral: Negative for memory loss. The patient is not nervous/anxious and does not have insomnia.   All other systems reviewed and are negative.   I have reviewed and (if needed) personally updated the patient's problem list, medications, allergies, past medical and surgical history, social and family history.   Past Medical History:  Diagnosis Date  . Ascending aortic aneurysm (HCC) 07/2012   4.2 cm   . Coronary artery disease involving native coronary artery of native heart with angina pectoris (HCC) 07/19/2012;; 10/2014   [facility]) Abnormal Nuclear ST --> Prox LAD ~75% calcified, RI - large & bifurcating w/ 90% lesion in medial (Diag) territory, Cx- small OM & PLB, RCA 80% mid --> s/p CABG x 3 at [facility] 2014;; b. CATH ([facility]) Patent Grafts, mLAD (after sm D1) 100%, Inf Branch of RI 100%, Superior RI branch (non-grafted) tandem ~80 & 65%, mRCA 100%, Cx mod Dz, dRCA - RPAV severe 90+%.  [name] Hyperlipidemia LDL goal <70   . Postoperative atrial fibrillation (HCC) 08/2012   Post-op CABG - Rx wiht Amiodarone; no reported recurrence.  . 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 Remo Lipps) LIMA-LAD, SVG-OM1 (Inf Branch of [place]), SVG-PDA    Past Surgical History:  Procedure Laterality Date  . CARDIAC CATHETERIZATION [name] 11/03/2014   Procedure: Left Heart Cath and Cors/Grafts Angiography;  Surgeon: Marykay Lex, MD;  Location: [facility] INVASIVE CV LAB;  Service: Cardiovascular;  mLAD (after sm D1) 100%, OM1/infRI - 100%, SupRI (non-grafted) 80 & 65%, Cx-OM diffuse mode Dz, mRCA 100% & RPAV 95%; Patent LIMA-LAD, SVG-RPDA, SVG-RI(OM)  .  CARDIAC CATHETERIZATION  07/2012   [facility]: 75% mLAD (after sm D`), RI - Inf branch 80%, Cx-OM moderate caliber,mRCA 80% & RPAV 95% --> CABG  . CORONARY ARTERY BYPASS GRAFT  2014   3V - LIMA-LAD, SVG-Inferior RI branch (called OM1), SVG-rPDA  . NM MYOVIEW LTD  07/2012   ABNORMAL: Mod Size, Mildly Severe Basal Mid & Apical Inferior reversible defect & ++ GXT portion  with limiting Angina --> CATH  . TRANSTHORACIC ECHOCARDIOGRAM  07/2012   [facility]: EF > 55%, Dgen MV Dz w/ mild MR, Ao Sclerosis, Mild AI, Ascending Aorta ~4.0-4.2 cm @ sinotubular Jxn    Cardiac Cath 10/2014 - 3/3 grafts patent   Current Meds  Medication Sig  . atorvastatin (LIPITOR) 80 MG tablet Take 0.5 tablets (40 mg total) by mouth daily.  [name] dronedarone (MULTAQ) 400 MG tablet Take 1 tablet (400 mg total) by mouth 2 (two) times daily with a meal.  . famotidine (PEPCID) 20 MG tablet One after breakfast and supper  . fluticasone (FLONASE) 50 MCG/ACT nasal spray Place 2 sprays into both nostrils as needed. Reported on 08/03/2015  . isosorbide mononitrate (IMDUR) 30 MG 24 hr tablet Take 1 tablet (30 mg total) by mouth daily.  . metoprolol tartrate (LOPRESSOR) 25 MG tablet Take 25 mg by mouth 2 (two) times daily. May take extra dose daily as needed.  . nitroGLYCERIN (NITROSTAT) 0.4 MG SL tablet PLACE 1 TABLET UNDER TONGUE EVERY 5 MINS AS NEEDED FOR CHEST PAIN CALL 911/EMS BY 3RD DOSE  . zolpidem (AMBIEN) 10 MG tablet 1/2 at bedtime as needed  . [DISCONTINUED] rivaroxaban (XARELTO) 20 MG TABS tablet Take 1 tablet (20 mg total) by mouth daily with supper.    Allergies  Allergen Reactions  . Pantoprazole     arthralgia    Social History   Social History  . Marital status: Married    Spouse name: [name]  . Number of children: [name]  . Years of education: [name]   Social History Main Topics  . Smoking status: Never Smoker  . Smokeless tobacco: Current User    Types: Chew  . Alcohol use 1.2 oz/week    2 Cans of beer per week     Comment: history of heavy ETOH abuse, 1/2 gallon of whiskey every 2 days  . Drug use: No  . Sexual activity: Not Asked   Other Topics Concern  . None   Social History Narrative  . None    family history includes Other in his mother; Stroke in his father.  Wt Readings from Last 3 Encounters:  08/21/16 208 lb (94.3 kg)  06/19/16 207 lb 3.2 oz (94 kg)    05/09/16 211 lb 3.2 oz (95.8 kg)    PHYSICAL EXAM BP 100/70   Pulse (!) 52   Ht 5\' 10"  (1.778 m)   Wt 208 lb (94.3 kg)   BMI 29.84 kg/m  Physical Exam  Constitutional: He is oriented to person, place, and time. He appears well-developed and well-nourished. No distress.  HENT:  Head: Normocephalic and atraumatic.  Neck: No hepatojugular reflux and no JVD present. Carotid bruit is not present.  Cardiovascular: Normal rate, regular rhythm, normal heart sounds and intact distal pulses.  Exam reveals no gallop and no friction rub.   No murmur heard. Pulmonary/Chest: Effort normal and breath sounds normal. No respiratory distress. He has no wheezes. He has no rales.  Abdominal: Soft. Bowel sounds are normal. He exhibits no distension. There is no tenderness. There is  no rebound.  Musculoskeletal: Normal range of motion. He exhibits no edema or deformity.  Neurological: He is alert and oriented to person, place, and time.  Skin: Skin is warm and dry. No erythema.  Psychiatric: He has a normal mood and affect. His behavior is normal. Thought content normal.  Nursing note and vitals reviewed.    Adult ECG Report [name]   Other studies Reviewed: Additional studies/ records that were reviewed today include:  Recent Labs:  n/a  ASSESSMENT / PLAN: Problem List Items Addressed This Visit    Atherosclerosis of native coronary artery with angina pectoris (HCC) - Primary (Chronic)    Still has significant native coronary disease but with patent grafts. Angina is well-controlled with current dose of Imdur, Lopressor. On statin. Not on aspirin or Plavix because he is now on full anticoagulation      Relevant Medications   apixaban (ELIQUIS) 5 MG TABS tablet   Hyperlipidemia LDL goal <70 (Chronic)    Lipids are managed by PCP. He was reduced to 40 mg atorvastatin.      Relevant Medications   apixaban (ELIQUIS) 5 MG TABS tablet   PAF (paroxysmal atrial fibrillation) (HCC) (Chronic)    No  further episodes since starting Multaq. He seems to be having issues with Xarelto. We will stop Xarelto month, then start ELIQUIS. -- Seek dental care (CHA2DS2Vasc ~2)  For now continue metoprolol and Multaq. We will see how he does off of one medicine first before we switch which both.       Relevant Medications   apixaban (ELIQUIS) 5 MG TABS tablet      Current medicines are reviewed at length with the patient today. (+/- concerns) - concerns re: adverse rxn to Xarelto / Multaq The following changes have been made: n/a  Patient Instructions  MEDICATION CHANGES  STOP XARELTO FOR 1 MONTH  AUG 7 THRU SEPT 7, 2018 DUE TO INCREASE BLEEDING GUMS DURING THIS TIME - PLEASE HAVE DENTAL CHECK CALL OFFICE TO INFORM US OF HOW YOU ARE DOING   WILL START ELIQUIS 5 MG TWICE A DAY AFTER THE MONTH OFF OF XARELTO. (WILL CALL IN TO PHARMACY ONCE WE DISCUSS THE MONTH IS UP)   Your physician recommends that you schedule a follow-up appointment in 3 MONTHS WITH DR HARDING.  If you need a refill on your cardiac medications before your next appointment, please call your pharmacy.     Studies Ordered:   No orders of the defined types were placed in this encounter.     Bryan Lemmaavid Harding, M.D., M.S. Interventional Cardiologist   Pager # [phone] Phone # [phone] [id number] [place], [place] [id number]

## 2016-08-21 NOTE — Patient Instructions (Addendum)
MEDICATION CHANGES  STOP XARELTO FOR 1 MONTH  AUG 7 THRU SEPT 7, 2018 DUE TO INCREASE BLEEDING GUMS DURING THIS TIME - PLEASE HAVE DENTAL CHECK CALL OFFICE TO INFORM US OF HOW YOU ARE DOING   WILL START ELIQUIS 5 MG TWICE A DAY AFTER THE MONTH OFF OF XARELTO. (WILL CALL IN TO PHARMACY ONCE WE DISCUSS THE MONTH IS UP)   Your physician recommends that you schedule a follow-up appointment in 3 MONTHS WITH DR HARDING.  If you need a refill on your cardiac medications before your next appointment, please call your pharmacy.

## 2016-08-23 ENCOUNTER — Encounter: Payer: Self-pay | Admitting: Cardiology

## 2016-08-23 NOTE — Assessment & Plan Note (Signed)
Still has significant native coronary disease but with patent grafts. Angina is well-controlled with current dose of Imdur, Lopressor. On statin. Not on aspirin or Plavix because he is now on full anticoagulation

## 2016-08-23 NOTE — Assessment & Plan Note (Signed)
No further episodes since starting Multaq. He seems to be having issues with Xarelto. We will stop Xarelto month, then start ELIQUIS. -- Seek dental care (CHA2DS2Vasc ~2)  For now continue metoprolol and Multaq. We will see how he does off of one medicine first before we switch which both.

## 2016-08-23 NOTE — Assessment & Plan Note (Signed)
Lipids are managed by PCP. He was reduced to 40 mg atorvastatin.

## 2016-10-02 ENCOUNTER — Other Ambulatory Visit: Payer: Self-pay | Admitting: Internal Medicine

## 2016-11-21 ENCOUNTER — Encounter: Payer: Self-pay | Admitting: Cardiology

## 2016-11-21 ENCOUNTER — Ambulatory Visit: Payer: BC Managed Care – PPO | Admitting: Cardiology

## 2016-11-21 VITALS — BP 100/70 | HR 60 | Ht 70.0 in | Wt 210.8 lb

## 2016-11-21 DIAGNOSIS — E785 Hyperlipidemia, unspecified: Secondary | ICD-10-CM

## 2016-11-21 DIAGNOSIS — I25119 Atherosclerotic heart disease of native coronary artery with unspecified angina pectoris: Secondary | ICD-10-CM | POA: Diagnosis not present

## 2016-11-21 DIAGNOSIS — R7989 Other specified abnormal findings of blood chemistry: Secondary | ICD-10-CM

## 2016-11-21 DIAGNOSIS — I480 Paroxysmal atrial fibrillation: Secondary | ICD-10-CM

## 2016-11-21 DIAGNOSIS — R5383 Other fatigue: Secondary | ICD-10-CM | POA: Diagnosis not present

## 2016-11-21 DIAGNOSIS — R945 Abnormal results of liver function studies: Secondary | ICD-10-CM | POA: Diagnosis not present

## 2016-11-21 NOTE — Assessment & Plan Note (Signed)
In retrospect, we never had a true documentation that he was in A. fib.  He has not tolerated Multitak he did not tolerate anticoagulation.  He has not had any further recurrent symptoms.  We discussed the episode that he had that led to the suggestion of him having A. fib, he was also notably dehydrated and fatigued.  I suspect that he could have just had sinus tachycardia from dehydration.  For now I think we can continue metoprolol 25 mg twice daily

## 2016-11-21 NOTE — Assessment & Plan Note (Signed)
Last check by PCP on October 18: total cholesterol 109, triglycerides 95, HDL 31 and LDL 59.  Doing much better on half dose of atorvastatin.

## 2016-11-21 NOTE — Patient Instructions (Addendum)
Medication Instructions:  Your physician recommends that you continue on your current medications as directed. Please refer to the Current Medication list given to you today.  Follow-Up: Your physician wants you to follow-up in: 12 months with Dr. Harding.  You will receive a reminder letter in the mail two months in advance. If you don't receive a letter, please call our office to schedule the follow-up appointment.   Any Other Special Instructions Will Be Listed Below (If Applicable).     If you need a refill on your cardiac medications before your next appointment, please call your pharmacy.   

## 2016-11-21 NOTE — Assessment & Plan Note (Signed)
Most recent check of LFTs shows that they are back into the normal range.

## 2016-11-21 NOTE — Assessment & Plan Note (Signed)
Feels much better since we cut down to 25 twice daily as opposed to 37.5 twice daily of metoprolol.  Also the reduced dose of statin has helped as well.

## 2016-11-21 NOTE — Assessment & Plan Note (Signed)
No recurrent anginal symptoms with rest or exertion.  Feels much better on lower dose of metoprolol.  Lipid panel looks well controlled on current dose of statin. Since we are now stopping his full anticoagulation, we have gone back to aspirin. He continues to be very active with no anginal symptoms.  For now would not follow-up ischemic evaluation until 2020 unless symptoms occur. Follow-up in 1 year.

## 2016-11-21 NOTE — Progress Notes (Signed)
PCP: Lonie Peak, PA-C  Clinic Note: Chief Complaint  Patient presents with  . Follow-up    HPI: Edgar Brooks is a 61 y.o. male with a PMH below who presents today for Follow-up of A. fib and CAD. History of CAD-CABG with postop A. fib.  He had a suspected recurrence back earlier this year, but it may not actually be in A. fib.    Edgar Brooks was last seen in August 2018, we switched him from Xarelto to Eliquis, and reduced his dose of atorvastatin to 40 mg daily and metoprolol to 25 mg twice daily.  He never did start the Eliquis, and stop Multitak due to continued lower GI symptoms.  Recent Hospitalizations: None  Studies Personally Reviewed - (if available, images/films reviewed: From Epic Chart or Care Everywhere)  Labs from PCP reviewed November 02, 2016 -labs scanned in chart  Interval History: [name] returns today feeling quite well.  He noted that he stopped taking Multaq because he continued to have the painful hemorrhoid sensation.  Ever since stopping that, the symptom went away.  He has had no further bleeding issues since stopping his full intake regulation. He also has not had any further episodes of rapid irregular heartbeats or palpitations.  He thinks that the episode earlier this year may not have been A. fib at all.  He thinks it may be more related to him being hot tired and dehydrated. Otherwise Edgar Brooks standpoint he is doing very well.  He feels better now that he has a long time.  He generally has had much better energy since we backed off on his metoprolol dose as well as the atorvastatin dose.   He has not any recurrence of any exertional chest tightness/pressure or dyspnea.  No PND, orthopnea or edema.  No rapid irregular heartbeats or palpitations, syncope/near syncope or TIA/amaurosis fugax.  No morebloody stools (melena or hematochezia.  No epistaxis or hematuria. No claudication.  ROS: A comprehensive was performed. Pertinent symptoms noted  above Review of Systems  Constitutional: Negative for malaise/fatigue.  HENT: Negative for nosebleeds.   Respiratory: Negative for cough, shortness of breath and wheezing.   Gastrointestinal: Negative for abdominal pain, constipation and heartburn.  Musculoskeletal: Positive for joint pain. Negative for myalgias.  Neurological: Negative for dizziness.  Endo/Heme/Allergies: Does not bruise/bleed easily.  Psychiatric/Behavioral: Negative for memory loss. The patient is not nervous/anxious and does not have insomnia.   All other systems reviewed and are negative.   I have reviewed and (if needed) personally updated the patient's problem list, medications, allergies, past medical and surgical history, social and family history.   Past Medical History:  Diagnosis Date  . Ascending aortic aneurysm (HCC) 07/2012   4.2 cm   . Coronary artery disease involving native coronary artery of native heart with angina pectoris (HCC) 07/19/2012;; 10/2014   [facility]) Abnormal Nuclear ST --> Prox LAD ~75% calcified, RI - large & bifurcating w/ 90% lesion in medial (Diag) territory, Cx- small OM & PLB, RCA 80% mid --> s/p CABG x 3 at [facility] 2014;; b. CATH ([facility]) Patent Grafts, mLAD (after sm D1) 100%, Inf Branch of RI 100%, Superior RI branch (non-grafted) tandem ~80 & 65%, mRCA 100%, Cx mod Dz, dRCA - RPAV severe 90+%.  [name] Hyperlipidemia LDL goal <70   . Postoperative atrial fibrillation (HCC) 08/2012   Post-op CABG - Rx wiht Amiodarone; no reported recurrence.  . 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 Remo Lipps) LIMA-LAD, SVG-OM1 (Inf  Branch of RI), SVG-PDA    Past Surgical History:  Procedure Laterality Date  . CARDIAC CATHETERIZATION  07/2012   [facility]: 75% mLAD (after sm D`), RI - Inf branch 80%, Cx-OM moderate caliber,mRCA 80% & RPAV 95% --> CABG  . CORONARY ARTERY BYPASS GRAFT  2014   3V - LIMA-LAD, SVG-Inferior RI branch (called OM1), SVG-rPDA  . NM MYOVIEW LTD  07/2012   ABNORMAL: Mod Size, Mildly Severe Basal Mid &  Apical Inferior reversible defect & ++ GXT portion with limiting Angina --> CATH  . TRANSTHORACIC ECHOCARDIOGRAM  07/2012   [facility]: EF > 55%, Dgen MV Dz w/ mild MR, Ao Sclerosis, Mild AI, Ascending Aorta ~4.0-4.2 cm @ sinotubular Jxn    Cardiac Cath 10/2014 - 3/3 grafts patent   Current Meds  Medication Sig  . aspirin EC 81 MG tablet Take 81 mg daily by mouth.  [name]. atorvastatin (LIPITOR) 80 MG tablet Take 0.5 tablets (40 mg total) by mouth daily.  . famotidine (PEPCID) 20 MG tablet One after breakfast and supper  . fluticasone (FLONASE) 50 MCG/ACT nasal spray Place 2 sprays into both nostrils as needed. Reported on 08/03/2015  . isosorbide mononitrate (IMDUR) 30 MG 24 hr tablet Take 1 tablet (30 mg total) by mouth daily.  . metoprolol tartrate (LOPRESSOR) 25 MG tablet Take 25 mg by mouth 2 (two) times daily. May take extra dose daily as needed.  . nitroGLYCERIN (NITROSTAT) 0.4 MG SL tablet PLACE 1 TABLET UNDER TONGUE EVERY 5 MINS AS NEEDED FOR CHEST PAIN CALL 911/EMS BY 3RD DOSE  . zolpidem (AMBIEN) 10 MG tablet 1/2 at bedtime as needed    Allergies  Allergen Reactions  . Pantoprazole     arthralgia    Social History   Socioeconomic History  . Marital status: Married    Spouse name: None  . Number of children: None  . Years of education: None  . Highest education level: None  Social Needs  . Financial resource strain: None  . Food insecurity - worry: None  . Food insecurity - inability: None  . Transportation needs - medical: None  . Transportation needs - non-medical: None  Occupational History  . None  Tobacco Use  . Smoking status: Never Smoker  . Smokeless tobacco: Current User    Types: Chew  Substance and Sexual Activity  . Alcohol use: Yes    Alcohol/week: 1.2 oz    Types: 2 Cans of beer per week    Comment: history of heavy ETOH abuse, 1/2 gallon of whiskey every 2 days  . Drug use: No  . Sexual activity: None  Other Topics Concern  . None  Social History  Narrative  . None    family history includes Other in his mother; Stroke in his father.  Wt Readings from Last 3 Encounters:  11/21/16 210 lb 12.8 oz (95.6 kg)  08/21/16 208 lb (94.3 kg)  06/19/16 207 lb 3.2 oz (94 kg)    PHYSICAL EXAM BP 100/70   Pulse 60   Ht 5\' 10"  (1.778 m)   Wt 210 lb 12.8 oz (95.6 kg)   BMI 30.25 kg/m  Physical Exam  Constitutional: He is oriented to person, place, and time. He appears well-developed and well-nourished. No distress.  HENT:  Head: Normocephalic and atraumatic.  Eyes: EOM are normal. No scleral icterus.  Neck: No hepatojugular reflux and no JVD present. Carotid bruit is not present.  Cardiovascular: Normal rate, regular rhythm, normal heart sounds and intact distal pulses.  No extrasystoles are present. PMI is not displaced. Exam reveals no gallop and no friction rub.  No murmur heard. Pulmonary/Chest: Effort normal and breath sounds normal. No respiratory distress. He has no wheezes. He has no rales.  Abdominal: Soft. Bowel sounds are normal. He exhibits no distension. There is no tenderness. There is no rebound.  Musculoskeletal: Normal range of motion. He exhibits no edema or deformity.  Neurological: He is alert and oriented to person, place, and time.  Skin: Skin is warm and dry. No erythema.  Psychiatric: He has a normal mood and affect. His behavior is normal. Thought content normal.  Nursing note and vitals reviewed.    Adult ECG Report [name]  Other studies Reviewed: Additional studies/ records that were reviewed today include:  Recent Labs: Labs reviewed -checked by PCP November 03, 2016.  Pertinent results noted in assessment and plan  ASSESSMENT / PLAN: Problem List Items Addressed This Visit    Atherosclerosis of native coronary artery with angina pectoris (HCC) - Primary (Chronic)    No recurrent anginal symptoms with rest or exertion.  Feels much better on lower dose of metoprolol.  Lipid panel looks well controlled on  current dose of statin. Since we are now stopping his full anticoagulation, we have gone back to aspirin. He continues to be very active with no anginal symptoms.  For now would not follow-up ischemic evaluation until 2020 unless symptoms occur. Follow-up in 1 year.      Relevant Medications   aspirin EC 81 MG tablet   Elevated LFTs    Most recent check of LFTs shows that they are back into the normal range.      Fatigue due to treatment    Feels much better since we cut down to 25 twice daily as opposed to 37.5 twice daily of metoprolol.  Also the reduced dose of statin has helped as well.      Hyperlipidemia LDL goal <70 (Chronic)    Last check by PCP on October 18: total cholesterol 109, triglycerides 95, HDL 31 and LDL 59.  Doing much better on half dose of atorvastatin.      Relevant Medications   aspirin EC 81 MG tablet   PAF (paroxysmal atrial fibrillation) (HCC) -most likely not a true diagnosis (Chronic)    In retrospect, we never had a true documentation that he was in A. fib.  He has not tolerated Multitak he did not tolerate anticoagulation.  He has not had any further recurrent symptoms.  We discussed the episode that he had that led to the suggestion of him having A. fib, he was also notably dehydrated and fatigued.  I suspect that he could have just had sinus tachycardia from dehydration.  For now I think we can continue metoprolol 25 mg twice daily      Relevant Medications   aspirin EC 81 MG tablet      Current medicines are reviewed at length with the patient today. (+/- concerns) - concerns re: adverse rxn to Xarelto / Multaq The following changes have been made: n/a  Patient Instructions  Medication Instructions:  Your physician recommends that you continue on your current medications as directed. Please refer to the Current Medication list given to you today.  Follow-Up: Your physician wants you to follow-up in: 12 months with Dr. Herbie Baltimore.  You will  receive a reminder letter in the mail two months in advance. If you don't receive a letter, please call our office to schedule the follow-up  appointment.   Any Other Special Instructions Will Be Listed Below (If Applicable).   If you need a refill on your cardiac medications before your next appointment, please call your pharmacy.     Studies Ordered:   No orders of the defined types were placed in this encounter.     Bryan Lemmaavid Harding, M.D., M.S. Interventional Cardiologist   Pager # [phone] Phone # [phone] [address] [place], [place] [address]

## 2017-06-12 ENCOUNTER — Emergency Department (HOSPITAL_COMMUNITY)
Admission: EM | Admit: 2017-06-12 | Discharge: 2017-06-12 | Disposition: A | Discharge status: Home / Self Care | Payer: BC Managed Care – PPO | Attending: Emergency Medicine | Admitting: Emergency Medicine

## 2017-06-12 ENCOUNTER — Emergency Department (HOSPITAL_COMMUNITY): Payer: BC Managed Care – PPO

## 2017-06-12 ENCOUNTER — Telehealth: Payer: Self-pay | Admitting: Medical

## 2017-06-12 ENCOUNTER — Encounter (HOSPITAL_COMMUNITY): Payer: Self-pay | Admitting: Emergency Medicine

## 2017-06-12 DIAGNOSIS — R002 Palpitations: Secondary | ICD-10-CM | POA: Insufficient documentation

## 2017-06-12 DIAGNOSIS — Z5321 Procedure and treatment not carried out due to patient leaving prior to being seen by health care provider: Secondary | ICD-10-CM | POA: Insufficient documentation

## 2017-06-12 LAB — CBC
HEMATOCRIT: 40.6 % (ref 39.0–52.0)
HEMOGLOBIN: 13.9 g/dL (ref 13.0–17.0)
MCH: 31.2 pg (ref 26.0–34.0)
MCHC: 34.2 g/dL (ref 30.0–36.0)
MCV: 91 fL (ref 78.0–100.0)
Platelets: 143 10*3/uL — ABNORMAL LOW (ref 150–400)
RBC: 4.46 MIL/uL (ref 4.22–5.81)
RDW: 12.7 % (ref 11.5–15.5)
WBC: 6.8 10*3/uL (ref 4.0–10.5)

## 2017-06-12 LAB — BASIC METABOLIC PANEL
Anion gap: 9 (ref 5–15)
BUN: 16 mg/dL (ref 6–20)
CO2: 25 mmol/L (ref 22–32)
Calcium: 8.9 mg/dL (ref 8.9–10.3)
Chloride: 108 mmol/L (ref 101–111)
Creatinine, Ser: 1.12 mg/dL (ref 0.61–1.24)
GFR calc Af Amer: >60 mL/min (ref >60)
GLUCOSE: 113 mg/dL — AB (ref 65–99)
POTASSIUM: 3.7 mmol/L (ref 3.5–5.1)
SODIUM: 142 mmol/L (ref 135–145)

## 2017-06-12 NOTE — ED Triage Notes (Signed)
Patient to ED c/o palpitations and SOB since last night, states he was in A-Fib for a while. EKG in triage shows sinus rhythm, pulse 70 and regular. Patient reports he called his PCP and was told to go to the ED. Resp e/u, skin warm/dry.

## 2017-06-12 NOTE — Telephone Encounter (Signed)
Pt's wife called answering service stating that her husband has been going in and out of atrial fibrillation today. She states that they were at [facility] earlier today however left given the long wait times. She states that while he was here, he was not in AF. Soon after arriving back home, he once again was in AF with rates sustaining in the 120-140's with frequent pulse checks. She states that he is having mild palpitations but is otherwise stable. He has a hx of post-operative AF only and is not currently on anticoagulation. I have recommended that they come to [facility] for further evaluation given his sustained rates.   Georgie Chard NP-C [facility] Pager: [phone]

## 2017-06-12 NOTE — ED Notes (Signed)
Secretary confirmed that Pt LWBS. To be moved OTF.

## 2017-06-13 NOTE — ED Notes (Signed)
Follow up call made  0920  06/13/17  s Perline Awe rn

## 2017-06-15 ENCOUNTER — Telehealth: Payer: Self-pay | Admitting: Cardiology

## 2017-06-15 NOTE — Telephone Encounter (Signed)
Reviewed with D.O.D  --Left message- need a little more information- current blodd pressure and heart rate

## 2017-06-15 NOTE — Telephone Encounter (Signed)
New Message    Patient c/o Palpitations:  High priority if patient c/o lightheadedness, shortness of breath, or chest pain  1) How long have you had palpitations/irregular HR/ Afib? Are you having the symptoms now? Yes , per wife he keeps going in and out of afib was in Er twice Tuesday   2) Are you currently experiencing lightheadedness, SOB or CP? Not yesterday when it started , on Tuesday he was lighthead , sob, and glassy eyes  3) Do you have a history of afib (atrial fibrillation) or irregular heart rhythm?  yes  4) Have you checked your BP or HR? (document readings if available): 111/60  120/84 was the highest   5) Are you experiencing any other symptoms? Hr stays at 60

## 2017-06-15 NOTE — Telephone Encounter (Signed)
Late entry spoke to patient @ 3:30 . Patient states blood pressure @ 1 pm today was 114/60 pulse 59. Reviewed information with  D.O.D.  continue with current  medication continue to monitor and keep appointment for Monday Patient verbalized understanding.

## 2017-06-15 NOTE — Telephone Encounter (Signed)
Follow up    Patient is returning  Sharon's call

## 2017-06-15 NOTE — Telephone Encounter (Signed)
Spoke to patient's wife. She states he is not having symptoms at present time.  Patient has been having episodes frequently this past a week. Patient is taking metoprolol tartrate 25 mg twice a day. Informed  Wife if symptoms return  Go to ER , appointment schedule for 06/18/17-at 11:40 am.

## 2017-06-15 NOTE — Telephone Encounter (Signed)
Spoke to wife . She was unable to given vital signs for today . Called left message for patient to call back

## 2017-06-18 ENCOUNTER — Encounter: Payer: Self-pay | Admitting: Cardiology

## 2017-06-18 ENCOUNTER — Ambulatory Visit: Payer: BC Managed Care – PPO | Admitting: Cardiology

## 2017-06-18 VITALS — BP 118/72 | HR 57 | Ht 69.0 in | Wt 207.2 lb

## 2017-06-18 DIAGNOSIS — R5383 Other fatigue: Secondary | ICD-10-CM | POA: Diagnosis not present

## 2017-06-18 DIAGNOSIS — E785 Hyperlipidemia, unspecified: Secondary | ICD-10-CM

## 2017-06-18 DIAGNOSIS — R002 Palpitations: Secondary | ICD-10-CM | POA: Diagnosis not present

## 2017-06-18 DIAGNOSIS — E8881 Metabolic syndrome: Secondary | ICD-10-CM

## 2017-06-18 DIAGNOSIS — I2510 Atherosclerotic heart disease of native coronary artery without angina pectoris: Secondary | ICD-10-CM

## 2017-06-18 MED ORDER — METOPROLOL TARTRATE 25 MG PO TABS: 37.5000 mg | ORAL_TABLET | Freq: Two times a day (BID) | ORAL | 3 refills | Status: DC

## 2017-06-18 NOTE — Patient Instructions (Signed)
Medication instructions   Take metoprolol tartrate. 37.5 mg twice a day , may take an extra 12.5 mg daily if need for palpations   PLEASE HAVE EVENT MONITOR REPORT FAXED TO OFFICE -- [phone].     Your physician recommends that you schedule a follow-up appointment in 1 month with Dr Herbie BaltimoreHARDING

## 2017-06-18 NOTE — Progress Notes (Signed)
PCP: Lonie Peak, PA-C  Clinic Note: Chief Complaint  Patient presents with  . Hospitalization Follow-up    states having some fluttering   . Coronary Artery Disease    History of CABG with postop A. fib.  Now concern for recurrence   HPI: Edgar Brooks with a PMH below who presents today for Follow-up of A. fib and CAD. History of CAD-CABG with postop A. fib.   He had a suspected recurrence back in 2018--> but it may not actually be in A. fib.  -- After a brief course of Anticoagulation with Xarelto-Eliquis and attempted rhythm control with Multaq, he went back to simply beta-blocker and aspirin.  Had been doing very well without any recurrent symptoms.  Edgar Brooks was last seen in November 2018 --noted having stopped Multaq because of GI issues.  (This later was found to be not related to Multaq).  Had not had any further episodes of irregular heartbeats or any anginal symptoms.  Did well with his trip to [place] for [employer].   Recent Hospitalizations:   ER/.Urgent Care visits visit Jun 12, 2017 -- working in garage -- washing up & sat down to rest -- felt heart fluttering; lasted just a few min.  But did not go away 2nd time - felt SOB & lightheadedness. -- in ER,not in Afib.  --> went home, then went back to [facility] in [place] -- still no Afib.  Placed 14 d monitor.  Studies Personally Reviewed - (if available, images/films reviewed: From Epic Chart or Care Everywhere)  Labs from PCP reviewed November 02, 2016 -labs scanned in chart  Interval History: Edgar Brooks returns today concerned about the recent episode that he had.  He has had a couple episodes where he felt like he may be having prolonged irregular heartbeats.  He finally went to his local urgent care and was started up with a 2-week monitor that he is currently wearing.  He really had 2 specific episodes lasting several minutes if not almost an hour on the regular fast  heartbeat.  Unfortunately nothing was captured and the first time is ER visit failed to show anything on EKG and so he did not want to stay around.  When it recurred he went back again he was captured.   He has not had any further episodes of irregular heartbeats.  With these spells, he is not noticing any  syncope or near syncope, but does feel tired fatigue. He actually saw on two 1-1/2 tabs twice daily of metoprolol and says that things have settled down now.    Otherwise, from a cardiac standpoint he really has not had any active angina or heart failure symptoms: No resting or exertional chest tightness/pressure or dyspnea.  No PND orthopnea or edema.  May be some mild swelling if he is on his feet for long time. He does have bruising, but on aspirin alone he is doing fine.  No melena, hematochezia, hematuria or epistaxis.  No claudication he says he has had 2 spells since wearing the monitor  --They were at least half an hour long.  He pushed the button.  So far we have not heard anything and I am not able to get the results until study is completed and sent to the company.  ROS: A comprehensive was performed. Pertinent symptoms noted above Review of Systems  Constitutional: Negative for malaise/fatigue.  HENT: Negative for nosebleeds.   Respiratory: Negative for  cough, shortness of breath and wheezing.   Cardiovascular: Positive for palpitations (Rapid heartbeat spells noted in HPI).  Gastrointestinal: Negative for abdominal pain, constipation and heartburn.  Musculoskeletal: Positive for joint pain. Negative for myalgias.  Neurological: Negative for dizziness.  Endo/Heme/Allergies: Does not bruise/bleed easily.  Psychiatric/Behavioral: Negative for memory loss. The patient is not nervous/anxious (Only anxious about the spells that he is having.  Still worried about A. fib) and does not have insomnia.   All other systems reviewed and are negative.   I have reviewed and (if needed)  personally updated the patient's problem list, medications, allergies, past medical and surgical history, social and family history.   Past Medical History:  Diagnosis Date  . Ascending aortic aneurysm (HCC) 07/2012   4.2 cm   . Coronary artery disease involving native coronary artery of native heart with angina pectoris (HCC) 07/19/2012;; 10/2014   [facility]) Abnormal Nuclear ST --> Prox LAD ~75% calcified, RI - large & bifurcating w/ 90% lesion in medial (Diag) territory, Cx- small OM & PLB, RCA 80% mid --> s/p CABG x 3 at [facility] 2014;; b. CATH ([facility]) Patent Grafts, mLAD (after sm D1) 100%, Inf Branch of RI 100%, Superior RI branch (non-grafted) tandem ~80 & 65%, mRCA 100%, Cx mod Dz, dRCA - RPAV severe 90+%.  [name] Hyperlipidemia LDL goal <70   . Postoperative atrial fibrillation (HCC) 08/2012   Post-op CABG - Rx wiht Amiodarone; no reported recurrence.  . 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 Remo Lipps) LIMA-LAD, SVG-OM1 (Inf Branch of [place]), SVG-PDA    Past Surgical History:  Procedure Laterality Date  . CARDIAC CATHETERIZATION [name] 11/03/2014   Procedure: Left Heart Cath and Cors/Grafts Angiography;  Surgeon: Marykay Lex, MD;  Location: [facility] INVASIVE CV LAB;  Service: Cardiovascular;  mLAD (after sm D1) 100%, OM1/infRI - 100%, SupRI (non-grafted) 80 & 65%, Cx-OM diffuse mode Dz, mRCA 100% & RPAV 95%; Patent LIMA-LAD, SVG-RPDA, SVG-RI(OM)  . CARDIAC CATHETERIZATION  07/2012   [facility]: 75% mLAD (after sm D`), RI - Inf branch 80%, Cx-OM moderate caliber,mRCA 80% & RPAV 95% --> CABG  . CORONARY ARTERY BYPASS GRAFT  2014   3V - LIMA-LAD, SVG-Inferior RI branch (called OM1), SVG-rPDA  . NM MYOVIEW LTD  07/2012   ABNORMAL: Mod Size, Mildly Severe Basal Mid & Apical Inferior reversible defect & ++ GXT portion with limiting Angina --> CATH  . TRANSTHORACIC ECHOCARDIOGRAM  07/2012   [facility]: EF > 55%, Dgen MV Dz w/ mild MR, Ao Sclerosis, Mild AI, Ascending Aorta ~4.0-4.2 cm @ sinotubular Jxn    Cardiac Cath 10/2014 - 3/3  grafts patent    Current Meds  Medication Sig  . aspirin EC 81 MG tablet Take 81 mg daily by mouth.  [name] atorvastatin (LIPITOR) 80 MG tablet Take 0.5 tablets (40 mg total) by mouth daily.  . famotidine (PEPCID) 20 MG tablet One after breakfast and supper  . fluticasone (FLONASE) 50 MCG/ACT nasal spray Place 2 sprays into both nostrils as needed. Reported on 08/03/2015  . isosorbide mononitrate (IMDUR) 30 MG 24 hr tablet Take 1 tablet (30 mg total) by mouth daily.  . nitroGLYCERIN (NITROSTAT) 0.4 MG SL tablet PLACE 1 TABLET UNDER TONGUE EVERY 5 MINS AS NEEDED FOR CHEST PAIN CALL 911/EMS BY 3RD DOSE  . zolpidem (AMBIEN) 10 MG tablet 1/2 at bedtime as needed  . [DISCONTINUED] metoprolol tartrate (LOPRESSOR) 25 MG tablet Take 25 mg by mouth 2 (two) times daily. May take extra dose daily  as needed.    Allergies  Allergen Reactions  . Pantoprazole     arthralgia    Social History   Tobacco Use  . Smoking status: Never Smoker  . Smokeless tobacco: Current User    Types: Chew  Substance Use Topics  . Alcohol use: Yes    Alcohol/week: 1.2 oz    Types: 2 Cans of beer per week    Comment: history of heavy ETOH abuse, 1/2 gallon of whiskey every 2 days  . Drug use: No   Social History   Social History Narrative  . Not on file    family history includes Other in his mother; Stroke in his father.  Wt Readings from Last 3 Encounters:  06/18/17 207 lb 3.2 oz (94 kg)  06/12/17 211 lb (95.7 kg)  11/21/16 210 lb 12.8 oz (95.6 kg)    PHYSICAL EXAM BP 118/72   Pulse (!) 57   Ht 5\' 9"  (1.753 m)   Wt 207 lb 3.2 oz (94 kg)   BMI 30.60 kg/m  Physical Exam  Constitutional: He is oriented to person, place, and time. He appears well-developed and well-nourished. No distress.  Healthy-appearing.  Well-groomed.  HENT:  Head: Normocephalic and atraumatic.  Eyes: No scleral icterus.  Neck: No hepatojugular reflux and no JVD present. Carotid bruit is not present.  Cardiovascular: Normal  rate, regular rhythm, normal heart sounds, intact distal pulses and normal pulses.  No extrasystoles are present. PMI is not displaced. Exam reveals no gallop and no friction rub.  No murmur heard. Pulmonary/Chest: Effort normal and breath sounds normal. No respiratory distress. He has no wheezes. He has no rales.  Abdominal: Soft. Bowel sounds are normal. He exhibits no distension. There is no tenderness. There is no rebound.  Musculoskeletal: Normal range of motion. He exhibits no edema or deformity.  Neurological: He is alert and oriented to person, place, and time.  Skin: Skin is warm.  Psychiatric: He has a normal mood and affect. His behavior is normal. Judgment and thought content normal.  Nursing note and vitals reviewed.    Adult ECG Report [name]  Other studies Reviewed: Additional studies/ records that were reviewed today include:  Recent Labs:   May 11, 2017: TC 137, TG 158, LDL 77, HDL 37.  BUN/creatinine 16/1.12.  TSH 1.85.   ASSESSMENT / PLAN: Problem List Items Addressed This Visit    Rapid palpitations - Primary    Yet again having other episodes of tachypalpitations.  Currently wearing monitor and he has at least 2 episodes that he pressed the button for that were similar to his initial presenting symptoms.  Need to wait for the results.    For now we will continue with current dose of 37.5 twice daily metoprolol with the potential for taking an additional 25 mg as tolerated. I will see him back after the monitor.      Relevant Orders   EKG 12-Lead (Completed)   Metabolic syndrome    Data would suggest that he needs at least 3 out of 5 criteria for metabolic syndrome with hyperglycemia, obesity with BMI of 30 and glucose greater than 100. CHD 10-year risk greater than 20%.  Diabetes risk due to metabolic syndrome.  Continue to follow blood pressure, lipids and glycemic control.  Low threshold for initiating treatment.       Hyperlipidemia LDL goal <70  (Chronic)    LDL is a little higher this time than it was last time.  Went up from 59-77.  Closely monitor.  He is on 40 of atorvastatin and that may be the explanation.  If he has a upward trend, may need to add Zetia.      Relevant Medications   metoprolol tartrate (LOPRESSOR) 25 MG tablet   Fatigue due to treatment (Chronic)    In the past, we reduced his beta-blocker dose down, but now that he is having more palpitations we have increased it back up to 37.5 twice daily.  His energy level seems to doing pretty well though so I think it may be related to having reduced his statin dose.      CAD, multiple vessel (Chronic)    No recurrent anginal symptoms.  Doing well.  Is active working in the garage doing vigorous work. He is on aspirin statin beta-blocker and Imdur.  Could consider weaning off Imdur if he has no recurrent symptoms.  His CABG was in 2014 and had a follow-up cath in 2016 showing patent grafts.  Pretty significant native disease.  Essentially graft dependent.  Would not be due for another ischemic evaluation until next year, however with no active symptoms, we can likely delay.      Relevant Medications   metoprolol tartrate (LOPRESSOR) 25 MG tablet      Current medicines are reviewed at length with the patient today. (+/- concerns) - concerns re: adverse rxn to Xarelto / Multaq The following changes have been made: n/a  Patient Instructions  Medication instructions   Take metoprolol tartrate. 37.5 mg twice a day , may take an extra 12.5 mg daily if need for palpations   PLEASE HAVE EVENT MONITOR REPORT FAXED TO OFFICE -- [phone].     Your physician recommends that you schedule a follow-up appointment in 1 month with Dr Herbie BaltimoreHARDING    Studies Ordered:   Orders Placed This Encounter  Procedures  . EKG 12-Lead      Bryan Lemmaavid Harding, M.D., M.S. Interventional Cardiologist   Pager # [phone] Phone # [phone] [address] [place], [place] [id number]

## 2017-06-20 ENCOUNTER — Encounter: Payer: Self-pay | Admitting: Cardiology

## 2017-06-21 DIAGNOSIS — E8881 Metabolic syndrome: Secondary | ICD-10-CM | POA: Insufficient documentation

## 2017-06-21 NOTE — Assessment & Plan Note (Signed)
In the past, we reduced his beta-blocker dose down, but now that he is having more palpitations we have increased it back up to 37.5 twice daily.  His energy level seems to doing pretty well though so I think it may be related to having reduced his statin dose.

## 2017-06-21 NOTE — Assessment & Plan Note (Signed)
No recurrent anginal symptoms.  Doing well.  Is active working in the garage doing vigorous work. He is on aspirin statin beta-blocker and Imdur.  Could consider weaning off Imdur if he has no recurrent symptoms.  His CABG was in 2014 and had a follow-up cath in 2016 showing patent grafts.  Pretty significant native disease.  Essentially graft dependent.  Would not be due for another ischemic evaluation until next year, however with no active symptoms, we can likely delay.

## 2017-06-21 NOTE — Assessment & Plan Note (Signed)
Yet again having other episodes of tachypalpitations.  Currently wearing monitor and he has at least 2 episodes that he pressed the button for that were similar to his initial presenting symptoms.  Need to wait for the results.    For now we will continue with current dose of 37.5 twice daily metoprolol with the potential for taking an additional 25 mg as tolerated. I will see him back after the monitor.

## 2017-06-21 NOTE — Assessment & Plan Note (Signed)
Data would suggest that he needs at least 3 out of 5 criteria for metabolic syndrome with hyperglycemia, obesity with BMI of 30 and glucose greater than 100. CHD 10-year risk greater than 20%.  Diabetes risk due to metabolic syndrome.  Continue to follow blood pressure, lipids and glycemic control.  Low threshold for initiating treatment.

## 2017-06-21 NOTE — Assessment & Plan Note (Signed)
LDL is a little higher this time than it was last time.  Went up from 59-77.  Closely monitor.  He is on 40 of atorvastatin and that may be the explanation.  If he has a upward trend, may need to add Zetia.

## 2017-08-13 ENCOUNTER — Encounter: Payer: Self-pay | Admitting: Cardiology

## 2017-08-14 NOTE — Telephone Encounter (Signed)
Left message for pt to call back re: his question about his monitor results. He had messaged back to let us know that he has the [employer] and I called them and they reported that Dr. Lucianne MussKumar had ordered the monitor and due to HIPAA they could not release any results to me. Left message to discuss with patient.

## 2017-08-16 ENCOUNTER — Ambulatory Visit: Payer: BC Managed Care – PPO | Admitting: Cardiology

## 2018-01-06 ENCOUNTER — Other Ambulatory Visit: Payer: Self-pay | Admitting: Cardiology

## 2018-04-04 ENCOUNTER — Other Ambulatory Visit: Payer: Self-pay | Admitting: Cardiology

## 2018-07-18 ENCOUNTER — Telehealth: Payer: Self-pay | Admitting: Cardiology

## 2018-07-31 NOTE — Telephone Encounter (Signed)
Filed in error

## 2019-03-16 NOTE — Progress Notes (Signed)
Cardiology Office Note   Date:  03/16/2019   ID:  KAUAN KLOOSTERMAN, DOB March 16, 1955, MRN [id number]  PCP:  Cyndi Bender, PA-C  Cardiologist:  Dr. Ellyn Hack No chief complaint on file.    History of Present Illness: Edgar MCPARLAND is a 64 y.o. male who presents we are following for ongoing assessment and management of CAD, s/p CABG 2014 at [facility] (LIMA to LAD, SVG to OM1, SVG-RPDA), post operative atrial fib with brief course of anticoagulation with Xarelto, to Eliquis. He was intolerant of Multaq due to GI issues.  His CABG was in 2014 and had a follow-up cath in 2016 showing patent grafts. Pretty significant native disease.  Essentially graft dependent.  Dr.Harding recommended that he would not be due for another ischemic evaluation until next year, however with no active symptoms, we can likely delay.  He was last seen by Dr Ellyn Hack on 06/18/2017 at which time he reported that he had irregular heart beats but took metoprolol with resolution of symptoms  He was continued on metoprolol 37.5 mg BID with potential to take additional 25 mg as needed.  Due to metabolic syndrome he was to have lipids and glycemic control followed. Goal of LDL < 70   He comes today without any cardiac complaints. He was diagnosed with COVID in March of 2020 which did not require hospitalization. He has not yet had COVID vaccine.  He remains active, he is a Engineer, civil (consulting). He denies chest pain, DOE, dizziness and fatigue. He is medically complaint.  Past Medical History:  Diagnosis Date  . Ascending aortic aneurysm ([place]) 07/2012   4.2 cm   . Coronary artery disease involving native coronary artery of native heart with angina pectoris ([place]) 07/19/2012;; 10/2014   [facility]) Abnormal Nuclear ST --> Prox LAD ~75% calcified, RI - large & bifurcating w/ 90% lesion in medial (Diag) territory, Cx- small OM & PLB, RCA 80% mid --> s/p CABG x 3 at [facility] 2014;; b. CATH ([facility]) Patent Grafts, mLAD (after sm D1) 100%, Inf Branch of RI  100%, Superior RI branch (non-grafted) tandem ~80 & 65%, mRCA 100%, Cx mod Dz, dRCA - RPAV severe 90+%.  [name] Hyperlipidemia LDL goal <70   . Postoperative atrial fibrillation ([place]) 08/2012   Post-op CABG - Rx wiht Amiodarone; no reported recurrence.  . 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 Anne Shutter) LIMA-LAD, SVG-OM1 (Inf Branch of [place]), SVG-PDA    Past Surgical History:  Procedure Laterality Date  . CARDIAC CATHETERIZATION [name] 11/03/2014   Procedure: Left Heart Cath and Cors/Grafts Angiography;  Surgeon: Leonie Man, MD;  Location: [place] CV LAB;  Service: Cardiovascular;  mLAD (after sm D1) 100%, OM1/infRI - 100%, SupRI (non-grafted) 80 & 65%, Cx-OM diffuse mode Dz, mRCA 100% & RPAV 95%; Patent LIMA-LAD, SVG-RPDA, SVG-RI(OM)  . CARDIAC CATHETERIZATION  07/2012   [facility]: 75% mLAD (after sm D`), RI - Inf branch 80%, Cx-OM moderate caliber,mRCA 80% & RPAV 95% --> CABG  . CORONARY ARTERY BYPASS GRAFT  2014   3V - LIMA-LAD, SVG-Inferior RI branch (called OM1), SVG-rPDA  . NM MYOVIEW LTD  07/2012   ABNORMAL: Mod Size, Mildly Severe Basal Mid & Apical Inferior reversible defect & ++ GXT portion with limiting Angina --> CATH  . TRANSTHORACIC ECHOCARDIOGRAM  07/2012   [facility]: EF > 55%, Dgen MV Dz w/ mild MR, Ao Sclerosis, Mild AI, Ascending Aorta ~4.0-4.2 cm @ sinotubular Jxn     Current Outpatient Medications  Medication Sig  Dispense Refill  . aspirin EC 81 MG tablet Take 81 mg daily by mouth.    [name] atorvastatin (LIPITOR) 80 MG tablet Take 0.5 tablets (40 mg total) by mouth daily. 45 tablet 3  . famotidine (PEPCID) 20 MG tablet One after breakfast and supper 60 tablet 11  . fluticasone (FLONASE) 50 MCG/ACT nasal spray Place 2 sprays into both nostrils as needed. Reported on 08/03/2015    . isosorbide mononitrate (IMDUR) 30 MG 24 hr tablet Take 1 tablet (30 mg total) by mouth daily. 30 tablet 0  . metoprolol tartrate (LOPRESSOR) 25 MG tablet TAKE 1 & 1/2 TABLETS BY MOUTH 2 TIMES A DAY. MAY TAKE AN  ADDITIONAL 1/2 TABLET IF NEEDED 315 tablet 1  . nitroGLYCERIN (NITROSTAT) 0.4 MG SL tablet PLACE 1 TABLET UNDER TONGUE EVERY 5 MINS AS NEEDED FOR CHEST PAIN CALL 911/EMS BY 3RD DOSE  1  . zolpidem (AMBIEN) 10 MG tablet 1/2 at bedtime as needed  5   No current facility-administered medications for this visit.    Allergies:   Pantoprazole    Social History:  The patient  reports that he has never smoked. His smokeless tobacco use includes chew. He reports current alcohol use of about 2.0 standard drinks of alcohol per week. He reports that he does not use drugs.   Family History:  The patient's family history includes Other in his mother; Stroke in his father.    ROS: All other systems are reviewed and negative. Unless otherwise mentioned in H&P    PHYSICAL EXAM: VS:  There were no vitals taken for this visit. , BMI There is no height or weight on file to calculate BMI. GEN: Well nourished, well developed, in no acute distress HEENT: normal Neck: no JVD, carotid bruits, or masses Cardiac: RRR; 1/6 systolic murmur, heard best at the LSB, no rubs, or gallops,no edema  Respiratory:  Clear to auscultation bilaterally, normal work of breathing GI: soft, nontender, nondistended, + BS MS: no deformity or atrophy Skin: warm and dry, no rash Neuro:  Strength and sensation are intact Psych: euthymic mood, full affect   EKG:  Personally reviewed, Sinus bradycardia rate of 53 bpm. Anterior infarction age undetermined.   Recent Labs: No results found for requested labs within last 8760 hours.    Lipid Panel No results found for: CHOL, TRIG, HDL, CHOLHDL, VLDL, LDLCALC, LDLDIRECT    Wt Readings from Last 3 Encounters:  06/18/17 207 lb 3.2 oz (94 kg)  06/12/17 211 lb (95.7 kg)  11/21/16 210 lb 12.8 oz (95.6 kg)      Other studies Reviewed: Cardiac Cath 10/2014 - 3/3 grafts patent    ASSESSMENT AND PLAN:  1. CAD:  Hx of CABG with severe native vessel disease. No cardiac  symptoms. Continue current regimen.  Labs recently completed by PCP, will request.   2. Bradycardia: Normal for CAD patients. Unless he is symptomatic, continue metoprolol as directed as this is also suppressing tachycardia and frequent PVC's.   3. Hyperlipidemia: Continue high dose statin. Goal of LDL is < 70.  He will have labs sent over from his PCP for our records.   4. GERD: Continue PPI.    Current medicines are reviewed at length with the patient today.  I have spent 30 minutes dedicated to the care of this patient on the date of this encounter to include pre-visit review of records, assessment, management and diagnostic testing,with shared decision making.  Labs/ tests ordered today include: Requesting labs from PCP  Bettey Mare. Liborio Nixon, ANP, [facility]   03/16/2019 8:18 AM    [facility] [address] Office [phone] Fax [phone]  Notice: This dictation was prepared with Dragon dictation along with smaller phrase technology. Any transcriptional errors that result from this process are unintentional and may not be corrected upon review.

## 2019-03-17 ENCOUNTER — Encounter: Payer: Self-pay | Admitting: Adult Health

## 2019-03-17 ENCOUNTER — Other Ambulatory Visit: Payer: Self-pay

## 2019-03-17 ENCOUNTER — Ambulatory Visit: Payer: BC Managed Care – PPO | Admitting: Adult Health

## 2019-03-17 VITALS — BP 126/78 | HR 53 | Ht 69.0 in | Wt 213.0 lb

## 2019-03-17 DIAGNOSIS — E785 Hyperlipidemia, unspecified: Secondary | ICD-10-CM | POA: Diagnosis not present

## 2019-03-17 DIAGNOSIS — I2510 Atherosclerotic heart disease of native coronary artery without angina pectoris: Secondary | ICD-10-CM

## 2019-03-17 DIAGNOSIS — B9721 SARS-associated coronavirus as the cause of diseases classified elsewhere: Secondary | ICD-10-CM | POA: Diagnosis not present

## 2019-03-17 DIAGNOSIS — K219 Gastro-esophageal reflux disease without esophagitis: Secondary | ICD-10-CM

## 2019-03-17 NOTE — Patient Instructions (Signed)
Medication Instructions:  Continue current medications  *If you need a refill on your cardiac medications before your next appointment, please call your pharmacy*  Lab Work: None Ordered  Testing/Procedures: None Ordered   Follow-Up: At [facility], you and your health needs are our priority.  As part of our continuing mission to provide you with exceptional heart care, we have created designated Provider Care Teams.  These Care Teams include your primary Cardiologist (physician) and Advanced Practice Providers (APPs -  Physician Assistants and Nurse Practitioners) who all work together to provide you with the care you need, when you need it.  We recommend signing up for the patient portal called "MyChart".  Sign up information is provided on this After Visit Summary.  MyChart is used to connect with patients for Virtual Visits (Telemedicine).  Patients are able to view lab/test results, encounter notes, upcoming appointments, etc.  Non-urgent messages can be sent to your provider as well.   To learn more about what you can do with MyChart, go to [email or web address].    Your next appointment:   1 year(s)  The format for your next appointment:   In Person  Provider:   You may see Dr David Harding or one of the following Advanced Practice Providers on your designated Care Team:    Rhonda Barrett, PA-C  Kathryn Lawrence, DNP, ANP  Cadence Furth, NP    

## 2019-08-12 NOTE — Progress Notes (Signed)
And labs from July 04, 2019 Na+ 143, K+ 4.5, Cl- 105, HCO3-26, BUN 19, Cr of 108, Glu 98; AST 25, ALT 31, AlkP 62 CBC: W 5.1, H/H 14.5/41.1, Plt 145 TC 138, TG 149, HDL 33, LDL 79   --> Target LDL is<70.  As per my last note, plan will be to add Zetia (ezetimibe)  Ezetimibe 10 mg p.o. nightly; DIS P #90 tabs, 4 refill  Bryan Lemma, MD

## 2019-08-14 ENCOUNTER — Other Ambulatory Visit: Payer: Self-pay | Admitting: *Deleted

## 2019-08-14 MED ORDER — EZETIMIBE 10 MG PO TABS: 10.0000 mg | ORAL_TABLET | Freq: Every day | ORAL | 3 refills | Status: DC

## 2019-10-10 NOTE — Progress Notes (Signed)
Labs from PCP (07/05/2019) Na+ 143, K+ 4.5, Cl- 104, HCO3-26, BUN 19, Cr 1.08, Glu 98, Ca2+ 8.9; AST 25, ALT 31, AlkP 62 CBC: W 5.1, H/H 14.5/41.1, Plt 145 TC 138, TG 149, HDL 33, LDL 79  labs are pretty stable from 2019. LDL goal is less than 70. -Recommend adding ezetimibe 10 mg daily to take with atorvastatin.  Bryan Lemma, MD  Rx: Ezetimibe 10 mg p.o. daily; dispense #90 tab, 4 refills

## 2020-02-16 LAB — EXTERNAL GENERIC LAB PROCEDURE: COLOGUARD: NEGATIVE

## 2020-02-16 LAB — COLOGUARD: COLOGUARD: NEGATIVE

## 2020-04-21 ENCOUNTER — Ambulatory Visit: Payer: BC Managed Care – PPO | Admitting: Cardiology

## 2020-04-21 ENCOUNTER — Other Ambulatory Visit: Payer: Self-pay

## 2020-04-21 ENCOUNTER — Encounter: Payer: Self-pay | Admitting: Cardiology

## 2020-04-21 VITALS — BP 130/71 | HR 54 | Ht 69.0 in | Wt 214.2 lb

## 2020-04-21 DIAGNOSIS — E785 Hyperlipidemia, unspecified: Secondary | ICD-10-CM

## 2020-04-21 DIAGNOSIS — R5383 Other fatigue: Secondary | ICD-10-CM | POA: Diagnosis not present

## 2020-04-21 DIAGNOSIS — I25118 Atherosclerotic heart disease of native coronary artery with other forms of angina pectoris: Secondary | ICD-10-CM

## 2020-04-21 DIAGNOSIS — I480 Paroxysmal atrial fibrillation: Secondary | ICD-10-CM

## 2020-04-21 DIAGNOSIS — R002 Palpitations: Secondary | ICD-10-CM | POA: Diagnosis not present

## 2020-04-21 DIAGNOSIS — I2510 Atherosclerotic heart disease of native coronary artery without angina pectoris: Secondary | ICD-10-CM

## 2020-04-21 NOTE — Patient Instructions (Addendum)
Medication Instructions:   take 1 month satin holiday ( stop taking for 1 month)  If no changes in fatigue after 3 to 4 weeks  Then restart medication  if you do see changes in fatigue - contact office will change medication to Rosuvastatin 20 mg daily   *If you need a refill on your cardiac medications before your next appointment, please call your pharmacy*   Lab Work:  Not needed   Testing/Procedures:  Not needed  Follow-Up: At [facility], you and your health needs are our priority.  As part of our continuing mission to provide you with exceptional heart care, we have created designated Provider Care Teams.  These Care Teams include your primary Cardiologist (physician) and Advanced Practice Providers (APPs -  Physician Assistants and Nurse Practitioners) who all work together to provide you with the care you need, when you need it.     Your next appointment:   12 month(s)  The format for your next appointment:   In Person  Provider:   Bryan Lemma, MD

## 2020-04-21 NOTE — Progress Notes (Signed)
Primary Care Provider: Lonie Peak, PA-C Cardiologist: No primary care provider on file. Electrophysiologist: None  Clinic Note: Chief Complaint  Patient presents with  . Follow-up    1 year since last visit, 3 years since last seen by me  . Coronary Artery Disease    No angina  . Fatigue    By 2 PM   ===================================  ASSESSMENT/PLAN   Problem List Items Addressed This Visit    Rapid palpitations    Had postop A. fib following CABG, along with some palpitations back in 2019, but has not had any symptoms since.  Continue current dose of beta-blocker.  Event monitor back in 2019 did show less than 1% A. fib-however this was not reported to the patient nor was the result readily available..  I was able to find the results through care everywhere after Mr. Edgar Brooks visit.  With no active symptoms, will continue with aspirin for now, but if he has recurrence of palpitations would probably want to reevaluate.,  And consider DOAC.        PAF (paroxysmal atrial fibrillation) (HCC)    He had postop A. fib, and apparently his Zio patch from 2019 did show short episodes of A. fib.  Unfortunately he was never notified of this result, and that the time we do not have the results available to [place].   I was able to deep surgeon to care everywhere and find the results, and review of his chart after his visit. If he would have more symptoms of tachycardia spells, consider reevaluation with monitor consider DOAC.       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  No active anginal symptoms with rest or exertion. Most recent ischemic evaluation was in 2016 with cardiac catheterization.  He is indicated that he would not be extending stress testing unless he were to have symptoms.    Plan:  Continue maintenance dose aspirin-okay to hold for procedures if  necessary.  Continue moderate-dose beta-blocker (metoprolol tartrate 37.5 mg twice daily) along with Imdur 30 mg for antianginal effect.  Remains on atorvastatin and Zetia.->  Continue Zetia while taking a statin holiday for 1 month.       Relevant Orders   EKG 12-Lead (Completed)   Hyperlipidemia LDL goal <70 (Chronic)    Labs are outstanding based on January's results.  He is currently on 40 mg of atorvastatin along with 10 mg Zetia. Edgar Brooks concerned about his and today fatigue and muscle weakness.  Just to exclude statin related myopathy, will have him do a statin holiday for a month.  If symptoms improved, would switch him to rosuvastatin 20 mg.  If no change, simply continue atorvastatin.      Fatigue due to treatment (Chronic)    We previously back beta-blocker dose to help with fatigue, to have worsening palpitations.  Seems to be more stable on this current dose of metoprolol.  However now he is noticing end of day fatigue.  I do not think this makes sense to consider adding a beta-blocker effect since midodrine today would probably be of the time that the morning dose of metoprolol will be waning.  Plan: 1 month statin holiday, then reassess symptoms.  If no change, then go back to standing dose of atorvastatin.  If symptoms have improved, would restart statin with rosuvastatin 20 mg        ===================================  HPI:    Edgar Brooks is a 65 y.o. male with a PMH notable for CAD-CABG in 2014 ([facility]: LIMA-LAD, SVG-OM1, SVG-PDA-patent as a follow-up cath in 2016 with severe native vessel disease-graft dependent.), postop A. Fib, HTN and HLD who presents today for annual follow-up.  Edgar Brooks was last seen by me on June 18, 2017.  Was noticing irregular heartbeats resolved with metoprolol.  Was continued on 37.5 mg twice daily PRN 25 mg dosing. He was seen most recently in March 2021 by Edgar ReiningKathryn Lawrence, NP--no complaints.  Had had COVID in March 2020 but no  hospitalization. => No med changes made  Recent Hospitalizations: none  Reviewed  CV studies:    The following studies were reviewed today: (if available, images/films reviewed: From Epic Chart or Care Everywhere) . Zio patch July 03, 2017- <1% A. fib burden ranging from 83 to 176 bpm (average 120 bpm).  Longest episode lasted 1 hour 40 minutes.  Rare isolated PVCs and PACs.  Patient triggered events correlated with sinus rhythm and PACs.  Interval History:   Edgar Brooks presents for essentially annual follow-up doing pretty well.  He was diagnosed with OSA and started on CPAP since I last saw him.  He has been working on a project in his son's house since about June.  He has been very active but the restoration project-moving from 1 job to another job.   He is always on the go but by about 2 PM feels like he wears out.  He just gets weak and fatigued.  This has gotten better some with eating little better, but he still gets tired.  He has not had any symptoms to suggest recurrence of angina or CHF.  No chest pain or pressure or exertional dyspnea.  We discussed how long its been since his last ischemic evaluation, and he indicated he would not want to do a stress test (has many reasons, not the least of which is cost). He has not noticed any significant palpitations since his last visit with me.  CV Review of Symptoms (Summary): no chest pain or dyspnea on exertion positive for - End of day fatigue and muscle weakness. negative for - edema, irregular heartbeat, orthopnea, palpitations, paroxysmal nocturnal dyspnea, rapid heart rate, shortness of breath or Lightheadedness or dizziness or wooziness, syncope/near syncope or TIA/amaurosis fugax, claudication  The patient does not have symptoms concerning for COVID-19 infection (fever, chills, cough, or new shortness of breath).   REVIEWED OF SYSTEMS   Review of Systems  Constitutional: Positive for malaise/fatigue (Easily fatigued by about  2 PM.  Ongoing until then.).  HENT: Negative for congestion and nosebleeds.   Respiratory: Positive for shortness of breath. Negative for cough.        Recently OSA.  Now on CPAP.  Diagnosed with  Cardiovascular:       Per HPI  Gastrointestinal: Negative for blood in stool and melena.  Genitourinary: Negative for hematuria.  Musculoskeletal: Positive for joint pain. Negative for myalgias (Not as much myalgias as just fatigue).  Neurological: Negative for dizziness, tingling and weakness.  Psychiatric/Behavioral: The patient has insomnia.    I have reviewed and (if needed) personally updated the patient's problem list, medications, allergies, past medical and surgical history, social and family history.   PAST MEDICAL HISTORY   Past Medical History:  Diagnosis Date  . Ascending aortic aneurysm (HCC) 07/2012   4.2 cm   . Coronary artery disease involving native coronary artery of native  heart with angina pectoris (HCC) 07/19/2012;; 10/2014   [facility]) Abnormal Nuclear ST --> Prox LAD ~75% calcified, RI - large & bifurcating w/ 90% lesion in medial (Diag) territory, Cx- small OM & PLB, RCA 80% mid --> s/p CABG x 3 at [facility] 2014;; b. CATH ([facility]) Patent Grafts, mLAD (after sm D1) 100%, Inf Branch of RI 100%, Superior RI branch (non-grafted) tandem ~80 & 65%, mRCA 100%, Cx mod Dz, dRCA - RPAV severe 90+%.  [name] Hyperlipidemia LDL goal <70   . Postoperative atrial fibrillation (HCC) 08/2012   Post-op CABG - Rx wiht Amiodarone; no reported recurrence.  . 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 Remo Lipps) LIMA-LAD, SVG-OM1 (Inf Branch of RI), SVG-PDA    PAST SURGICAL HISTORY   Past Surgical History:  Procedure Laterality Date  . CARDIAC CATHETERIZATION [name] 11/03/2014   Procedure: Left Heart Cath and Cors/Grafts Angiography;  Surgeon: Marykay Lex, MD;  Location[facility] INVASIVE CV LAB;  Service: Cardiovascular;  mLAD (after sm D1) 100%, OM1/infRI - 100%, SupRI (non-grafted) 80 & 65%, Cx-OM diffuse mode Dz, mRCA  100% & RPAV 95%; Patent LIMA-LAD, SVG-RPDA, SVG-RI(OM)  . CARDIAC CATHETERIZATION  07/2012   [facility]: 75% mLAD (after sm D`), RI - Inf branch 80%, Cx-OM moderate caliber,mRCA 80% & RPAV 95% --> CABG  . CORONARY ARTERY BYPASS GRAFT  2014   3V - LIMA-LAD, SVG-Inferior RI branch (called OM1), SVG-rPDA  . NM MYOVIEW LTD  07/2012   ABNORMAL: Mod Size, Mildly Severe Basal Mid & Apical Inferior reversible defect & ++ GXT portion with limiting Angina --> CATH  . TRANSTHORACIC ECHOCARDIOGRAM  07/2012   [facility]: EF > 55%, Dgen MV Dz w/ mild MR, Ao Sclerosis, Mild AI, Ascending Aorta ~4.0-4.2 cm @ sinotubular Jxn    Other studies Reviewed: Cardiac Cath 10/2014 - 3/3 grafts patent.  Severe occlusive native disease.    There is no immunization history on file for this patient.  MEDICATIONS/ALLERGIES   Current Meds  Medication Sig  . aspirin EC 81 MG tablet Take 81 mg daily by mouth.  [name] atorvastatin (LIPITOR) 40 MG tablet Take 40 mg by mouth daily.  [name] esomeprazole (NEXIUM) 40 MG capsule Take 40 mg by mouth daily.  . isosorbide mononitrate (IMDUR) 30 MG 24 hr tablet Take 1 tablet (30 mg total) by mouth daily.  . metoprolol tartrate (LOPRESSOR) 25 MG tablet TAKE 1 & 1/2 TABLETS BY MOUTH 2 TIMES A DAY. MAY TAKE AN ADDITIONAL 1/2 TABLET IF NEEDED  . nitroGLYCERIN (NITROSTAT) 0.4 MG SL tablet PLACE 1 TABLET UNDER TONGUE EVERY 5 MINS AS NEEDED FOR CHEST PAIN CALL 911/EMS BY 3RD DOSE  . zolpidem (AMBIEN) 10 MG tablet 1/2 at bedtime as needed  . [DISCONTINUED] famotidine (PEPCID) 20 MG tablet One after breakfast and supper    Allergies  Allergen Reactions  . Pantoprazole     arthralgia    SOCIAL HISTORY/FAMILY HISTORY   Reviewed in Epic:  Pertinent findings:  Social History   Tobacco Use  . Smoking status: Never Smoker  . Smokeless tobacco: Current User    Types: Chew  Substance Use Topics  . Alcohol use: Yes    Alcohol/week: 2.0 standard drinks    Types: 2 Cans of beer per week    Comment:  history of heavy ETOH abuse, 1/2 gallon of whiskey every 2 days  . Drug use: No   Social History   Social History Narrative  . Not on file  He is very active.  He works as a Geophysicist/field seismologist -PE, EKG, labs   Wt Readings from Last 3 Encounters:  04/21/20 214 lb 3.2 oz (97.2 kg)  03/17/19 213 lb (96.6 kg)  06/18/17 207 lb 3.2 oz (94 kg)    Physical Exam: BP 130/71   Pulse (!) 54   Ht  (1.753 m)   Wt 214 lb 3.2 oz (97.2 kg)   SpO2 99%   BMI 31.63 kg/m  Physical Exam Constitutional:      General: He is not in acute distress.    Appearance: Normal appearance. He is obese. He is not ill-appearing (Healthy.  Well-groomed.), toxic-appearing or diaphoretic.  HENT:     Head: Normocephalic and atraumatic.  Cardiovascular:     Rate and Rhythm: Normal rate and regular rhythm.     Pulses: Normal pulses.     Heart sounds: Murmur (1/6C-D SEM RUSB.) heard.  No friction rub.  Pulmonary:     Effort: Pulmonary effort is normal. No respiratory distress.     Breath sounds: Normal breath sounds.  Chest:     Chest wall: No tenderness.  Musculoskeletal:        General: No swelling. Normal range of motion.     Cervical back: Normal range of motion and neck supple.  Lymphadenopathy:     Cervical: No cervical adenopathy.  Skin:    General: Skin is warm and dry.  Neurological:     General: No focal deficit present.     Mental Status: He is alert and oriented to person, place, and time.     Gait: Gait normal.  Psychiatric:        Mood and Affect: Mood normal.        Behavior: Behavior normal.        Thought Content: Thought content normal.        Judgment: Judgment normal.     Adult ECG Report  Rate: 54 ;  Rhythm: sinus bradycardia and Otherwise normal axis, intervals and durations.;   Narrative Interpretation: Stable EKG.  Recent Labs:     02/04/2020: TC 102, TG 104, HDL 34 LDL 48.  Glucose 95.  BUN/Cr 16/1.13.  TSH 2.87. No results found for: CHOL, HDL, LDLCALC,  LDLDIRECT, TRIG, CHOLHDL Lab Results  Component Value Date   CREATININE 1.12 06/12/2017   BUN 16 06/12/2017   NA 142 06/12/2017   K 3.7 06/12/2017   CL 108 06/12/2017   CO2 25 06/12/2017   CBC Latest Ref Rng & Units 06/12/2017 05/27/2015 11/04/2014  WBC 4.0 - 10.5 K/uL 6.8 6.4 4.3  Hemoglobin 13.0 - 17.0 g/dL 19.1 47.8 29.5  Hematocrit 39.0 - 52.0 % 40.6 40.2 37.6(L)  Platelets 150 - 400 K/uL 143(L) 199.0 124(L)    Lab Results  Component Value Date   TSH 1.19 05/27/2015    ==================================================  COVID-19 Education: The signs and symptoms of COVID-19 were discussed with the patient and how to seek care for testing (follow up with PCP or arrange E-visit).   The importance of social distancing and COVID-19 vaccination was discussed today. The patient is practicing social distancing & Masking.   I spent a total of with the patient spent in direct patient consultation.  Additional time spent with chart review  / charting (studies, outside notes, etc): 12 min Total Time: 34 min  Current medicines are reviewed at length with the patient today.  (+/- concerns) n/a  This visit occurred during the SARS-CoV-2 public health emergency.  Safety protocols were  in place, including screening questions prior to the visit, additional usage of staff PPE, and extensive cleaning of exam room while observing appropriate contact time as indicated for disinfecting solutions.  Notice: This dictation was prepared with Dragon dictation along with smaller phrase technology. Any transcriptional errors that result from this process are unintentional and may not be corrected upon review.  Patient Instructions / Medication Changes & Studies & Tests Ordered   Patient Instructions  Medication Instructions:   take 1 month satin holiday ( stop taking for 1 month)  If no changes in fatigue after 3 to 4 weeks  Then restart medication  if you do see changes in fatigue -  contact office will change medication to Rosuvastatin 20 mg daily   *If you need a refill on your cardiac medications before your next appointment, please call your pharmacy*   Lab Work:  Not needed   Testing/Procedures:  Not needed  Follow-Up: At [facility], you and your health needs are our priority.  As part of our continuing mission to provide you with exceptional heart care, we have created designated Provider Care Teams.  These Care Teams include your primary Cardiologist (physician) and Advanced Practice Providers (APPs -  Physician Assistants and Nurse Practitioners) who all work together to provide you with the care you need, when you need it.     Your next appointment:   12 month(s)  The format for your next appointment:   In Person  Provider:   Bryan Lemma, MD       Studies Ordered:   Orders Placed This Encounter  Procedures  . EKG 12-Lead     Bryan Lemma, M.D., M.S. Interventional Cardiologist   Pager # [phone] Phone # [phone] [address] [place], [place] [address]   Thank you for choosing Heartcare at [facility]!!

## 2020-05-07 ENCOUNTER — Encounter: Payer: Self-pay | Admitting: Cardiology

## 2020-05-07 DIAGNOSIS — I4891 Unspecified atrial fibrillation: Secondary | ICD-10-CM | POA: Insufficient documentation

## 2020-05-07 DIAGNOSIS — I480 Paroxysmal atrial fibrillation: Secondary | ICD-10-CM | POA: Insufficient documentation

## 2020-05-07 NOTE — Assessment & Plan Note (Signed)
We previously back beta-blocker dose to help with fatigue, to have worsening palpitations.  Seems to be more stable on this current dose of metoprolol.  However now he is noticing end of day fatigue.  I do not think this makes sense to consider adding a beta-blocker effect since midodrine today would probably be of the time that the morning dose of metoprolol will be waning.  Plan: 1 month statin holiday, then reassess symptoms.  If no change, then go back to standing dose of atorvastatin.  If symptoms have improved, would restart statin with rosuvastatin 20 mg

## 2020-05-07 NOTE — Assessment & Plan Note (Addendum)
Had postop A. fib following CABG, along with some palpitations back in 2019, but has not had any symptoms since.  Continue current dose of beta-blocker.  Event monitor back in 2019 did show less than 1% A. fib-however this was not reported to the patient nor was the result readily available..  I was able to find the results through care everywhere after Edgar Brooks visit.  With no active symptoms, will continue with aspirin for now, but if he has recurrence of palpitations would probably want to reevaluate.,  And consider DOAC.

## 2020-05-07 NOTE — Assessment & Plan Note (Addendum)
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  No active anginal symptoms with rest or exertion. Most recent ischemic evaluation was in 2016 with cardiac catheterization.  He is indicated that he would not be extending stress testing unless he were to have symptoms.    Plan:  Continue maintenance dose aspirin-okay to hold for procedures if necessary.  Continue moderate-dose beta-blocker (metoprolol tartrate 37.5 mg twice daily) along with Imdur 30 mg for antianginal effect.  Remains on atorvastatin and Zetia.->  Continue Zetia while taking a statin holiday for 1 month.

## 2020-05-07 NOTE — Assessment & Plan Note (Signed)
He had postop A. fib, and apparently his Zio patch from 2019 did show short episodes of A. fib.  Unfortunately he was never notified of this result, and that the time we do not have the results available to [place].   I was able to deep surgeon to care everywhere and find the results, and review of his chart after his visit. If he would have more symptoms of tachycardia spells, consider reevaluation with monitor consider DOAC.

## 2020-05-07 NOTE — Assessment & Plan Note (Addendum)
Labs are outstanding based on January's results.  He is currently on 40 mg of atorvastatin along with 10 mg Zetia. [name] concerned about his and today fatigue and muscle weakness.  Just to exclude statin related myopathy, will have him do a statin holiday for a month.  If symptoms improved, would switch him to rosuvastatin 20 mg.  If no change, simply continue atorvastatin.

## 2020-05-23 ENCOUNTER — Other Ambulatory Visit: Payer: Self-pay | Admitting: Cardiology

## 2020-07-08 ENCOUNTER — Other Ambulatory Visit: Payer: Self-pay | Admitting: Cardiology

## 2020-08-28 NOTE — Progress Notes (Signed)
Lab results from PCP: 08/03/2020 Na+ 143, K+ 5.1, Cl- 104, HCO3-23, BUN 17, Cr 1.12, Glu 99, Ca2+ 9.4; AST 41, ALT 56 (both little elevated), AlkP 61 / T protein 6.8, albumin 4.6 TC 99, TG 82, HDL 32, LDL cac 50**outstanding  Lipid panel is outstanding.  At [facility].  I am not sure whether he the statin holiday helped his fatigue symptoms.  Need to confirm if he was back on his regular statin dose or not.  These labs are great, I think we can continue what ever he was taking.  Bryan Lemma c

## 2020-09-15 ENCOUNTER — Observation Stay (HOSPITAL_BASED_OUTPATIENT_CLINIC_OR_DEPARTMENT_OTHER): Payer: BC Managed Care – PPO

## 2020-09-15 ENCOUNTER — Inpatient Hospital Stay (HOSPITAL_COMMUNITY)
Admission: EM | Disposition: A | Discharge status: Home / Self Care | Payer: Self-pay | Source: Home / Self Care | Attending: Internal Medicine

## 2020-09-15 ENCOUNTER — Encounter (HOSPITAL_COMMUNITY): Payer: Self-pay | Admitting: Internal Medicine

## 2020-09-15 ENCOUNTER — Inpatient Hospital Stay (HOSPITAL_COMMUNITY)
Admission: EM | Admit: 2020-09-15 | Discharge: 2020-09-16 | DRG: 247 | Disposition: A | Discharge status: Home / Self Care | Payer: BC Managed Care – PPO | Attending: Internal Medicine | Admitting: Internal Medicine

## 2020-09-15 ENCOUNTER — Emergency Department (HOSPITAL_COMMUNITY): Payer: BC Managed Care – PPO

## 2020-09-15 DIAGNOSIS — I2510 Atherosclerotic heart disease of native coronary artery without angina pectoris: Secondary | ICD-10-CM | POA: Insufficient documentation

## 2020-09-15 DIAGNOSIS — I25110 Atherosclerotic heart disease of native coronary artery with unstable angina pectoris: Secondary | ICD-10-CM | POA: Diagnosis not present

## 2020-09-15 DIAGNOSIS — I200 Unstable angina: Secondary | ICD-10-CM

## 2020-09-15 DIAGNOSIS — F17220 Nicotine dependence, chewing tobacco, uncomplicated: Secondary | ICD-10-CM | POA: Diagnosis present

## 2020-09-15 DIAGNOSIS — Z7982 Long term (current) use of aspirin: Secondary | ICD-10-CM

## 2020-09-15 DIAGNOSIS — Z9861 Coronary angioplasty status: Secondary | ICD-10-CM | POA: Insufficient documentation

## 2020-09-15 DIAGNOSIS — R7989 Other specified abnormal findings of blood chemistry: Secondary | ICD-10-CM | POA: Diagnosis not present

## 2020-09-15 DIAGNOSIS — R079 Chest pain, unspecified: Secondary | ICD-10-CM | POA: Diagnosis present

## 2020-09-15 DIAGNOSIS — Z955 Presence of coronary angioplasty implant and graft: Secondary | ICD-10-CM

## 2020-09-15 DIAGNOSIS — E785 Hyperlipidemia, unspecified: Secondary | ICD-10-CM | POA: Diagnosis present

## 2020-09-15 DIAGNOSIS — Z20822 Contact with and (suspected) exposure to covid-19: Secondary | ICD-10-CM | POA: Diagnosis present

## 2020-09-15 DIAGNOSIS — I480 Paroxysmal atrial fibrillation: Secondary | ICD-10-CM | POA: Diagnosis not present

## 2020-09-15 DIAGNOSIS — Z79899 Other long term (current) drug therapy: Secondary | ICD-10-CM

## 2020-09-15 DIAGNOSIS — D6959 Other secondary thrombocytopenia: Secondary | ICD-10-CM | POA: Diagnosis present

## 2020-09-15 DIAGNOSIS — I25118 Atherosclerotic heart disease of native coronary artery with other forms of angina pectoris: Secondary | ICD-10-CM | POA: Diagnosis present

## 2020-09-15 DIAGNOSIS — Z951 Presence of aortocoronary bypass graft: Secondary | ICD-10-CM

## 2020-09-15 DIAGNOSIS — K219 Gastro-esophageal reflux disease without esophagitis: Secondary | ICD-10-CM | POA: Diagnosis present

## 2020-09-15 DIAGNOSIS — I10 Essential (primary) hypertension: Secondary | ICD-10-CM | POA: Diagnosis present

## 2020-09-15 DIAGNOSIS — I4891 Unspecified atrial fibrillation: Secondary | ICD-10-CM | POA: Diagnosis present

## 2020-09-15 DIAGNOSIS — I712 Thoracic aortic aneurysm, without rupture: Secondary | ICD-10-CM | POA: Diagnosis present

## 2020-09-15 HISTORY — PX: LEFT HEART CATH AND CORS/GRAFTS ANGIOGRAPHY: CATH118250

## 2020-09-15 HISTORY — PX: TRANSTHORACIC ECHOCARDIOGRAM: SHX275

## 2020-09-15 HISTORY — PX: CORONARY STENT INTERVENTION: CATH118234

## 2020-09-15 LAB — CBC
HCT: 39.1 % (ref 39.0–52.0)
HCT: 39.7 % (ref 39.0–52.0)
Hemoglobin: 13 g/dL (ref 13.0–17.0)
Hemoglobin: 13.6 g/dL (ref 13.0–17.0)
MCH: 31.9 pg (ref 26.0–34.0)
MCH: 32.4 pg (ref 26.0–34.0)
MCHC: 33.2 g/dL (ref 30.0–36.0)
MCHC: 34.3 g/dL (ref 30.0–36.0)
MCV: 96.1 fL (ref 80.0–100.0)
MCV: 94.5 fL (ref 80.0–100.0)
Platelets: 127 10*3/uL — ABNORMAL LOW (ref 150–400)
Platelets: 135 10*3/uL — ABNORMAL LOW (ref 150–400)
RBC: 4.07 MIL/uL — ABNORMAL LOW (ref 4.22–5.81)
RBC: 4.2 MIL/uL — ABNORMAL LOW (ref 4.22–5.81)
RDW: 12.5 % (ref 11.5–15.5)
RDW: 12.6 % (ref 11.5–15.5)
WBC: 4.5 10*3/uL (ref 4.0–10.5)
WBC: 7.2 10*3/uL (ref 4.0–10.5)
nRBC: 0 % (ref 0.0–0.2)
nRBC: 0 % (ref 0.0–0.2)

## 2020-09-15 LAB — HIV ANTIBODY (ROUTINE TESTING W REFLEX): HIV Screen 4th Generation wRfx: NONREACTIVE

## 2020-09-15 LAB — COMPREHENSIVE METABOLIC PANEL
ALT: 54 U/L — ABNORMAL HIGH (ref 0–44)
AST: 57 U/L — ABNORMAL HIGH (ref 15–41)
Albumin: 3.6 g/dL (ref 3.5–5.0)
Alkaline Phosphatase: 49 U/L (ref 38–126)
Anion gap: 6 (ref 5–15)
BUN: 18 mg/dL (ref 8–23)
CO2: 24 mmol/L (ref 22–32)
Calcium: 8.6 mg/dL — ABNORMAL LOW (ref 8.9–10.3)
Chloride: 107 mmol/L (ref 98–111)
Creatinine, Ser: 1.04 mg/dL (ref 0.61–1.24)
GFR, Estimated: >60 mL/min (ref >60)
Glucose, Bld: 112 mg/dL — ABNORMAL HIGH (ref 70–99)
Potassium: 3.7 mmol/L (ref 3.5–5.1)
Sodium: 137 mmol/L (ref 135–145)
Total Bilirubin: 0.6 mg/dL (ref 0.3–1.2)
Total Protein: 6 g/dL — ABNORMAL LOW (ref 6.5–8.1)

## 2020-09-15 LAB — TROPONIN I (HIGH SENSITIVITY)
Troponin I (High Sensitivity): 4 ng/L (ref <18)
Troponin I (High Sensitivity): 4 ng/L (ref <18)

## 2020-09-15 LAB — CREATININE, SERUM
Creatinine, Ser: 1.02 mg/dL (ref 0.61–1.24)
GFR, Estimated: >60 mL/min (ref >60)

## 2020-09-15 LAB — ECHOCARDIOGRAM COMPLETE
Area-P 1/2: 3 cm2
S' Lateral: 3.7 cm

## 2020-09-15 LAB — SARS CORONAVIRUS 2 (TAT 6-24 HRS): SARS Coronavirus 2: NEGATIVE

## 2020-09-15 LAB — POCT ACTIVATED CLOTTING TIME: Activated Clotting Time: 346 seconds

## 2020-09-15 SURGERY — LEFT HEART CATH AND CORS/GRAFTS ANGIOGRAPHY
Anesthesia: LOCAL

## 2020-09-15 MED ORDER — EZETIMIBE 10 MG PO TABS
10.0000 mg | ORAL_TABLET | Freq: Every day | ORAL | Status: DC
  Administered 2020-09-15 – 2020-09-16 (×2): 10 mg via ORAL
  Filled 2020-09-15 (×3): qty 1

## 2020-09-15 MED ORDER — SODIUM CHLORIDE 0.9 % IV SOLN
INTRAVENOUS | Status: AC | PRN
  Administered 2020-09-15: 20 mL/h via INTRAVENOUS

## 2020-09-15 MED ORDER — LIDOCAINE HCL (PF) 1 % IJ SOLN
INTRAMUSCULAR | Status: DC | PRN
  Administered 2020-09-15: 2 mL

## 2020-09-15 MED ORDER — SODIUM CHLORIDE 0.9% FLUSH
3.0000 mL | Freq: Two times a day (BID) | INTRAVENOUS | Status: DC
  Administered 2020-09-16: 3 mL via INTRAVENOUS

## 2020-09-15 MED ORDER — ASPIRIN 81 MG PO CHEW
81.0000 mg | CHEWABLE_TABLET | Freq: Every day | ORAL | Status: DC
  Administered 2020-09-16: 81 mg via ORAL
  Filled 2020-09-15: qty 1

## 2020-09-15 MED ORDER — VERAPAMIL HCL 2.5 MG/ML IV SOLN
INTRAVENOUS | Status: AC
  Filled 2020-09-15: qty 2

## 2020-09-15 MED ORDER — TICAGRELOR 90 MG PO TABS
90.0000 mg | ORAL_TABLET | Freq: Two times a day (BID) | ORAL | Status: DC
  Administered 2020-09-15 – 2020-09-16 (×2): 90 mg via ORAL
  Filled 2020-09-15 (×2): qty 1

## 2020-09-15 MED ORDER — IOHEXOL 350 MG/ML SOLN
INTRAVENOUS | Status: DC | PRN
  Administered 2020-09-15: 130 mL

## 2020-09-15 MED ORDER — NITROGLYCERIN 1 MG/10 ML FOR IR/CATH LAB
INTRA_ARTERIAL | Status: AC
  Filled 2020-09-15: qty 10

## 2020-09-15 MED ORDER — MORPHINE SULFATE (PF) 2 MG/ML IV SOLN: 1.0000 mg | INTRAVENOUS | Status: DC | PRN

## 2020-09-15 MED ORDER — HEPARIN (PORCINE) IN NACL 1000-0.9 UT/500ML-% IV SOLN
INTRAVENOUS | Status: AC
  Filled 2020-09-15: qty 1000

## 2020-09-15 MED ORDER — VERAPAMIL HCL 2.5 MG/ML IV SOLN
INTRAVENOUS | Status: DC | PRN
  Administered 2020-09-15 (×2): 200 ug via INTRACORONARY

## 2020-09-15 MED ORDER — HEPARIN SODIUM (PORCINE) 1000 UNIT/ML IJ SOLN
INTRAMUSCULAR | Status: AC
  Filled 2020-09-15: qty 1

## 2020-09-15 MED ORDER — VERAPAMIL HCL 2.5 MG/ML IV SOLN
INTRAVENOUS | Status: DC | PRN
  Administered 2020-09-15: 10 mL via INTRA_ARTERIAL

## 2020-09-15 MED ORDER — HYDRALAZINE HCL 20 MG/ML IJ SOLN: 10.0000 mg | INTRAMUSCULAR | Status: AC | PRN

## 2020-09-15 MED ORDER — SODIUM CHLORIDE 0.9% FLUSH: 3.0000 mL | INTRAVENOUS | Status: DC | PRN

## 2020-09-15 MED ORDER — NITROGLYCERIN 0.4 MG SL SUBL: 0.4000 mg | SUBLINGUAL_TABLET | SUBLINGUAL | Status: DC | PRN

## 2020-09-15 MED ORDER — MIDAZOLAM HCL 2 MG/2ML IJ SOLN
INTRAMUSCULAR | Status: AC
  Filled 2020-09-15: qty 2

## 2020-09-15 MED ORDER — FENTANYL CITRATE (PF) 100 MCG/2ML IJ SOLN
INTRAMUSCULAR | Status: DC | PRN
  Administered 2020-09-15: 25 ug via INTRAVENOUS

## 2020-09-15 MED ORDER — LABETALOL HCL 5 MG/ML IV SOLN: 10.0000 mg | INTRAVENOUS | Status: AC | PRN

## 2020-09-15 MED ORDER — FENTANYL CITRATE (PF) 100 MCG/2ML IJ SOLN
INTRAMUSCULAR | Status: AC
  Filled 2020-09-15: qty 2

## 2020-09-15 MED ORDER — ISOSORBIDE MONONITRATE ER 30 MG PO TB24
30.0000 mg | ORAL_TABLET | Freq: Every day | ORAL | Status: DC
  Administered 2020-09-15: 30 mg via ORAL
  Filled 2020-09-15 (×2): qty 1

## 2020-09-15 MED ORDER — SODIUM CHLORIDE 0.9 % IV SOLN: 250.0000 mL | INTRAVENOUS | Status: DC | PRN

## 2020-09-15 MED ORDER — SODIUM CHLORIDE 0.9% FLUSH: 3.0000 mL | Freq: Two times a day (BID) | INTRAVENOUS | Status: DC

## 2020-09-15 MED ORDER — ZOLPIDEM TARTRATE 5 MG PO TABS: 5.0000 mg | ORAL_TABLET | Freq: Every evening | ORAL | Status: DC | PRN

## 2020-09-15 MED ORDER — ONDANSETRON HCL 4 MG/2ML IJ SOLN: 4.0000 mg | Freq: Four times a day (QID) | INTRAMUSCULAR | Status: DC | PRN

## 2020-09-15 MED ORDER — MIDAZOLAM HCL 2 MG/2ML IJ SOLN
INTRAMUSCULAR | Status: DC | PRN
  Administered 2020-09-15: 2 mg via INTRAVENOUS

## 2020-09-15 MED ORDER — ACETAMINOPHEN 325 MG PO TABS: 650.0000 mg | ORAL_TABLET | ORAL | Status: DC | PRN

## 2020-09-15 MED ORDER — SODIUM CHLORIDE 0.9 % IV SOLN: INTRAVENOUS | Status: AC

## 2020-09-15 MED ORDER — TICAGRELOR 90 MG PO TABS
ORAL_TABLET | ORAL | Status: DC | PRN
  Administered 2020-09-15: 180 mg via ORAL

## 2020-09-15 MED ORDER — ATORVASTATIN CALCIUM 40 MG PO TABS
40.0000 mg | ORAL_TABLET | Freq: Every day | ORAL | Status: DC
  Administered 2020-09-15: 40 mg via ORAL
  Filled 2020-09-15 (×2): qty 1

## 2020-09-15 MED ORDER — LIDOCAINE HCL (PF) 1 % IJ SOLN
INTRAMUSCULAR | Status: AC
  Filled 2020-09-15: qty 30

## 2020-09-15 MED ORDER — METOPROLOL TARTRATE 25 MG PO TABS
37.5000 mg | ORAL_TABLET | Freq: Two times a day (BID) | ORAL | Status: DC
  Administered 2020-09-15 – 2020-09-16 (×3): 37.5 mg via ORAL
  Filled 2020-09-15: qty 1
  Filled 2020-09-15: qty 2
  Filled 2020-09-15: qty 1

## 2020-09-15 MED ORDER — ASPIRIN EC 81 MG PO TBEC
81.0000 mg | DELAYED_RELEASE_TABLET | Freq: Every day | ORAL | Status: DC
  Administered 2020-09-15: 81 mg via ORAL
  Filled 2020-09-15: qty 1

## 2020-09-15 MED ORDER — PANTOPRAZOLE SODIUM 40 MG PO TBEC
40.0000 mg | DELAYED_RELEASE_TABLET | Freq: Every day | ORAL | Status: DC
  Administered 2020-09-15: 40 mg via ORAL
  Filled 2020-09-15 (×2): qty 1

## 2020-09-15 MED ORDER — HEPARIN (PORCINE) IN NACL 1000-0.9 UT/500ML-% IV SOLN
INTRAVENOUS | Status: DC | PRN
  Administered 2020-09-15 (×2): 500 mL

## 2020-09-15 MED ORDER — HEPARIN SODIUM (PORCINE) 1000 UNIT/ML IJ SOLN
INTRAMUSCULAR | Status: DC | PRN
  Administered 2020-09-15 (×2): 5000 [IU] via INTRAVENOUS

## 2020-09-15 MED ORDER — TICAGRELOR 90 MG PO TABS
ORAL_TABLET | ORAL | Status: AC
  Filled 2020-09-15: qty 2

## 2020-09-15 MED ORDER — ENOXAPARIN SODIUM 40 MG/0.4ML IJ SOSY
40.0000 mg | PREFILLED_SYRINGE | Freq: Every day | INTRAMUSCULAR | Status: DC
  Administered 2020-09-15 – 2020-09-16 (×2): 40 mg via SUBCUTANEOUS
  Filled 2020-09-15 (×2): qty 0.4

## 2020-09-15 SURGICAL SUPPLY — 18 items
CATH INFINITI 5 FR AR1 MOD (CATHETERS) ×1 IMPLANT
CATH INFINITI 5 FR AR2 MOD (CATHETERS) ×1 IMPLANT
CATH INFINITI 5 FR IM (CATHETERS) ×1 IMPLANT
CATH INFINITI 5FR MULTPACK ANG (CATHETERS) ×1 IMPLANT
CATH LAUNCHER 6FR AR2 (CATHETERS) ×1 IMPLANT
DEVICE RAD COMP TR BAND LRG (VASCULAR PRODUCTS) ×1 IMPLANT
GLIDESHEATH SLEND SS 6F .021 (SHEATH) ×1 IMPLANT
GUIDEWIRE INQWIRE 1.5J.035X260 (WIRE) IMPLANT
INQWIRE 1.5J .035X260CM (WIRE) ×2
KIT ENCORE 26 ADVANTAGE (KITS) ×1 IMPLANT
KIT HEART LEFT (KITS) ×2 IMPLANT
PACK CARDIAC CATHETERIZATION (CUSTOM PROCEDURE TRAY) ×2 IMPLANT
SHEATH PROBE COVER 6X72 (BAG) ×1 IMPLANT
STENT ONYX FRONTIER 3.5X18 (Permanent Stent) ×1 IMPLANT
TRANSDUCER W/STOPCOCK (MISCELLANEOUS) ×2 IMPLANT
TUBING CIL FLEX 10 FLL-RA (TUBING) ×2 IMPLANT
VALVE COPILOT STAT (MISCELLANEOUS) ×1 IMPLANT
WIRE ASAHI PROWATER 180CM (WIRE) ×1 IMPLANT

## 2020-09-15 NOTE — ED Provider Notes (Signed)
[facility] EMERGENCY DEPARTMENT Provider Note  CSN: [id number] Arrival date & time: 09/15/20 0142  Chief Complaint(s) Chest Pain  HPI Edgar Brooks is a 65 y.o. male with a past medical history listed below including CAD status post CABG who presents to the emergency department with substernal chest pain at rest x2.  Reports that initially resolved with nitroglycerin.  Several hours later he had recurrence of the pain but worse and associated with diaphoresis.  Nitroglycerin did not resolve back pain.  He did take 324 mg of aspirin.  Pain resolved as EMS arrived.  He denies any recent fevers or infections.  No coughing or congestion.  No nausea or vomiting.  No abdominal pain.  Currently remains chest pain-free.  Reports he has not had stress testing or echo since his CABG.   Chest Pain  Past Medical History Past Medical History:  Diagnosis Date   Ascending aortic aneurysm (HCC) 07/2012   4.2 cm    Coronary artery disease involving native coronary artery of native heart with angina pectoris (HCC) 07/19/2012;; 10/2014   ([facility]) Abnormal Nuclear ST --> Prox LAD ~75% calcified, RI - large & bifurcating w/ 90% lesion in medial (Diag) territory, Cx- small OM & PLB, RCA 80% mid --> s/p CABG x 3 at [facility] 2014;; b. CATH ([facility]) Patent Grafts, mLAD (after sm D1) 100%, Inf Branch of RI 100%, Superior RI branch (non-grafted) tandem ~80 & 65%, mRCA 100%, Cx mod Dz, dRCA - RPAV severe 90+%.   Hyperlipidemia LDL goal <70    Postoperative atrial fibrillation (HCC) 08/2012   Post-op CABG - Rx wiht Amiodarone; no reported recurrence.   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 Remo Lipps) LIMA-LAD, SVG-OM1 (Inf Branch of [place]), SVG-PDA   Patient Active Problem List   Diagnosis Date Noted   Chest pain 09/15/2020   PAF (paroxysmal atrial fibrillation) (HCC) 05/07/2020   SARS-associated coronavirus infection 04/10/2018   Metabolic syndrome 06/21/2017   Fatigue due to treatment 05/09/2016   GERD  (gastroesophageal reflux disease) 08/03/2015   Upper airway cough syndrome 05/27/2015   Hyperlipidemia LDL goal <70    Elevated LFTs 11/03/2014   Rapid palpitations 08/16/2012   CAD, multiple vessel 07/29/2012   Home Medication(s) Prior to Admission medications   Medication Sig Start Date End Date Taking? Authorizing Provider  aspirin EC 81 MG tablet Take 81 mg daily by mouth.    [provider]  atorvastatin (LIPITOR) 40 MG tablet Take 40 mg by mouth daily.    [provider]  esomeprazole (NEXIUM) 40 MG capsule Take 40 mg by mouth daily. 12/27/18   [provider]  ezetimibe (ZETIA) 10 MG tablet TAKE 1 TABLET BY MOUTH EVERY DAY 07/09/20   Marykay Lex, MD  isosorbide mononitrate (IMDUR) 30 MG 24 hr tablet Take 1 tablet (30 mg total) by mouth daily. 11/04/14   Hongalgi, Maximino Greenland, MD  metoprolol tartrate (LOPRESSOR) 25 MG tablet TAKE 1 & 1/2 TABLETS BY MOUTH 2 TIMES A DAY. MAY TAKE AN ADDITIONAL 1/2 TABLET IF NEEDED 04/04/18   Marykay Lex, MD  nitroGLYCERIN (NITROSTAT) 0.4 MG SL tablet PLACE 1 TABLET UNDER TONGUE EVERY 5 MINS AS NEEDED FOR CHEST PAIN CALL 911/EMS BY 3RD DOSE 10/26/14   [provider]  zolpidem (AMBIEN) 10 MG tablet 1/2 at bedtime as needed 04/22/15   [provider]  Past Surgical History Past Surgical History:  Procedure Laterality Date   CARDIAC CATHETERIZATION [name] 11/03/2014   Procedure: Left Heart Cath and Cors/Grafts Angiography;  Surgeon: Marykay Lex, MD;  Location: [facility] INVASIVE CV LAB;  Service: Cardiovascular;  mLAD (after sm D1) 100%, OM1/infRI - 100%, SupRI (non-grafted) 80 & 65%, Cx-OM diffuse mode Dz, mRCA 100% & RPAV 95%; Patent LIMA-LAD, SVG-RPDA, SVG-RI(OM)   CARDIAC CATHETERIZATION  07/2012   [facility]: 75% mLAD (after sm D`), RI - Inf branch 80%, Cx-OM moderate caliber,mRCA 80% & RPAV  95% --> CABG   CORONARY ARTERY BYPASS GRAFT  2014   3V - LIMA-LAD, SVG-Inferior RI branch (called OM1), SVG-rPDA   NM MYOVIEW LTD  07/2012   ABNORMAL: Mod Size, Mildly Severe Basal Mid & Apical Inferior reversible defect & ++ GXT portion with limiting Angina --> CATH   TRANSTHORACIC ECHOCARDIOGRAM  07/2012   [facility]: EF > 55%, Dgen MV Dz w/ mild MR, Ao Sclerosis, Mild AI, Ascending Aorta ~4.0-4.2 cm @ sinotubular Jxn   Family History Family History  Problem Relation Age of Onset   Stroke Father    Other Mother        stomach problems     Social History Social History   Tobacco Use   Smoking status: Never   Smokeless tobacco: Current    Types: Chew  Substance Use Topics   Alcohol use: Yes    Alcohol/week: 2.0 standard drinks    Types: 2 Cans of beer per week    Comment: history of heavy ETOH abuse, 1/2 gallon of whiskey every 2 days   Drug use: No   Allergies Pantoprazole  Review of Systems Review of Systems  Cardiovascular:  Positive for chest pain.  All other systems are reviewed and are negative for acute change except as noted in the HPI  Physical Exam Vital Signs  I have reviewed the triage vital signs BP 126/76   Pulse (!) 57   Temp 97.8 F (36.6 C)   Resp 18   SpO2 99%   Physical Exam Vitals reviewed.  Constitutional:      General: He is not in acute distress.    Appearance: He is well-developed. He is not diaphoretic.  HENT:     Head: Normocephalic and atraumatic.     Nose: Nose normal.  Eyes:     General: No scleral icterus.       Right eye: No discharge.        Left eye: No discharge.     Conjunctiva/sclera: Conjunctivae normal.     Pupils: Pupils are equal, round, and reactive to light.  Cardiovascular:     Rate and Rhythm: Normal rate and regular rhythm.     Heart sounds: No murmur heard.   No friction rub. No gallop.  Pulmonary:     Effort: Pulmonary effort is normal. No respiratory distress.     Breath sounds: Normal breath sounds. No  stridor. No rales.  Abdominal:     General: There is no distension.     Palpations: Abdomen is soft.     Tenderness: There is no abdominal tenderness.  Musculoskeletal:        General: No tenderness.     Cervical back: Normal range of motion and neck supple.  Skin:    General: Skin is warm and dry.     Findings: No erythema or rash.  Neurological:     Mental Status: He is alert and oriented to person, place, and time.  ED Results and Treatments Labs (all labs ordered are listed, but only abnormal results are displayed) Labs Reviewed  CBC - Abnormal; Notable for the following components:      Result Value   RBC 4.07 (*)    Platelets 127 (*)    All other components within normal limits  COMPREHENSIVE METABOLIC PANEL - Abnormal; Notable for the following components:   Glucose, Bld 112 (*)    Calcium 8.6 (*)    Total Protein 6.0 (*)    AST 57 (*)    ALT 54 (*)    All other components within normal limits  SARS CORONAVIRUS 2 (TAT 6-24 HRS)  TROPONIN I (HIGH SENSITIVITY)  TROPONIN I (HIGH SENSITIVITY)                                                                                                                         EKG  EKG Interpretation  Date/Time:  Wednesday September 15 2020 01:52:40 EDT Ventricular Rate:  56 PR Interval:  217 QRS Duration: 90 QT Interval:  404 QTC Calculation: 390 R Axis:   48 Text Interpretation: Sinus rhythm Borderline prolonged PR interval Anteroseptal infarct, age indeterminate No significant change was found Confirmed by Drema Pry [phone]) on 09/15/2020 1:57:03 AM       Radiology DG Chest 2 View  Result Date: 09/15/2020 CLINICAL DATA:  Chest pain. EXAM: CHEST - 2 VIEW COMPARISON:  Chest radiograph dated 06/12/2017. FINDINGS: Left lung base streaky atelectasis or scarring. No focal consolidation, pleural effusion, or pneumothorax. Stable cardiomediastinal silhouette. Median sternotomy wires and CABG vascular clips. No acute osseous  pathology. IMPRESSION: No active cardiopulmonary disease. Electronically Signed   By: Elgie Collard M.D.   On: 09/15/2020 02:49    Pertinent labs & imaging results that were available during my care of the patient were reviewed by me and considered in my medical decision making (see MDM for details).  Medications Ordered in ED Medications - No data to display                                                                                                                                   Procedures Procedures  (including critical care time)  Medical Decision Making / ED Course I have reviewed the nursing notes for this encounter and the patient's prior records (if available in EHR or on provided paperwork).  [name] was evaluated in  Emergency Department on 09/15/2020 for the symptoms described in the history of present illness. He was evaluated in the context of the global COVID-19 pandemic, which necessitated consideration that the patient might be at risk for infection with the SARS-CoV-2 virus that causes COVID-19. Institutional protocols and algorithms that pertain to the evaluation of patients at risk for COVID-19 are in a state of rapid change based on information released by regulatory bodies including the CDC and federal and state organizations. These policies and algorithms were followed during the patient's care in the ED.     Patient presents with substernal chest pain resolved with nitroglycerin. Concerning for ACS, specifically unstable angina.  Pertinent labs & imaging results that were available during my care of the patient were reviewed by me and considered in my medical decision making:  EKG without acute ischemic changes or evidence of pericarditis. Initial troponin negative.  Low suspicion for pulmonary embolism. Presentation not classic pleuritic dissection or esophageal perforation. Chest x-ray without evidence suggestive of pneumonia, pneumothorax,  pneumomediastinum.  No abnormal contour of the mediastinum to suggest dissection. No evidence of acute injuries.  Case discussed cardiology who agreed for medicine admit to rule out ACS and perform stress testing.  Final Clinical Impression(s) / ED Diagnoses Final diagnoses:  Chest pain  Unstable angina [facility](HCC)     This chart was dictated using voice recognition software.  Despite best efforts to proofread,  errors can occur which can change the documentation meaning.    Nira Connardama, Arnelle Nale Eduardo, MD 09/15/20 0530

## 2020-09-15 NOTE — Interval H&P Note (Signed)
Cath Lab Visit (complete for each Cath Lab visit)  Clinical Evaluation Leading to the Procedure:   ACS: Yes.    Non-ACS:    Anginal Classification: CCS IV  Anti-ischemic medical therapy: Minimal Therapy (1 class of medications)  Non-Invasive Test Results: No non-invasive testing performed  Prior CABG: Previous CABG      History and Physical Interval Note:  09/15/2020 2:58 PM  Edgar Brooks  has presented today for surgery, with the diagnosis of unstable angina.  The various methods of treatment have been discussed with the patient and family. After consideration of risks, benefits and other options for treatment, the patient has consented to  Procedure(s): LEFT HEART CATH AND CORS/GRAFTS ANGIOGRAPHY ([name]) as a surgical intervention.  The patient's history has been reviewed, patient examined, no change in status, stable for surgery.  I have reviewed the patient's chart and labs.  Questions were answered to the patient's satisfaction.     Lance Muss

## 2020-09-15 NOTE — ED Triage Notes (Signed)
EMS arrival. Began experiencing angina. Took 1 Nitro SL at 2000 last night to resolve. Pain returned 2nd Nitro at 0100. Dizziness upon standing. Pt complaining of headache.

## 2020-09-15 NOTE — Progress Notes (Signed)
Brief same-day note: Patient seen and examined at the bedside this morning.  Hemodynamically stable.  He denies any chest pain during my evaluation.  He has been planned for cardiac cath today.  Examination was largely unremarkable. Patient was seen by Dr. Toniann Fail this morning.  I agree with assessment and plan.

## 2020-09-15 NOTE — Progress Notes (Signed)
  Echocardiogram 2D Echocardiogram has been performed.  Edgar Brooks 09/15/2020, 11:59 AM

## 2020-09-15 NOTE — H&P (Signed)
History and Physical    Edgar Brooks SHF:[id number] DOB: 03-Jul-1955 DOA: 09/15/2020  PCP: Lonie Peak, PA-C  Patient coming from:   Home.  Chief Complaint: Chest pain.  HPI: Edgar Brooks is a 65 y.o. male with history of CAD status post CABG, ascending aortic aneurysm, paroxysmal atrial fibrillation presents to the ER with complaints of having chest pain.  Patient states last night around 8 PM while watching television patient started developing central chest pain more like burning sensation.  Patient took some sublingual nitroglycerin following which his chest pain resolved.  Later in the night around 1 PM patient started having chest pain more intense at this time and became diaphoretic.  He took nitroglycerin again but no relief so he called EMS who advised him to take aspirin.  He took aspirin and his chest pain resolved.  Denies any associated nausea vomiting abdominal pain or shortness of breath.  ED Course: In the ER patient was chest pain-free.  High sensitive troponins were negative chest x-ray unremarkable.  Labs show mildly elevated LFTs.  EKG shows nonspecific changes.  ER physician discussed with on-call cardiologist.  Patient admitted for possible unstable angina.  COVID test pending.  Review of Systems: As per HPI, rest all negative.   Past Medical History:  Diagnosis Date   Ascending aortic aneurysm (HCC) 07/2012   4.2 cm    Coronary artery disease involving native coronary artery of native heart with angina pectoris (HCC) 07/19/2012;; 10/2014   [facility]) Abnormal Nuclear ST --> Prox LAD ~75% calcified, RI - large & bifurcating w/ 90% lesion in medial (Diag) territory, Cx- small OM & PLB, RCA 80% mid --> s/p CABG x 3 at [facility] 2014;; b. CATH ([facility]) Patent Grafts, mLAD (after sm D1) 100%, Inf Branch of RI 100%, Superior RI branch (non-grafted) tandem ~80 & 65%, mRCA 100%, Cx mod Dz, dRCA - RPAV severe 90+%.   Hyperlipidemia LDL goal <70    Postoperative atrial fibrillation  (HCC) 08/2012   Post-op CABG - Rx wiht Amiodarone; no reported recurrence.   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 Remo Lipps) LIMA-LAD, SVG-OM1 (Inf Branch of [place]), SVG-PDA    Past Surgical History:  Procedure Laterality Date   CARDIAC CATHETERIZATION [name] 11/03/2014   Procedure: Left Heart Cath and Cors/Grafts Angiography;  Surgeon: Marykay Lex, MD;  Location: [facility] INVASIVE CV LAB;  Service: Cardiovascular;  mLAD (after sm D1) 100%, OM1/infRI - 100%, SupRI (non-grafted) 80 & 65%, Cx-OM diffuse mode Dz, mRCA 100% & RPAV 95%; Patent LIMA-LAD, SVG-RPDA, SVG-RI(OM)   CARDIAC CATHETERIZATION  07/2012   [facility]: 75% mLAD (after sm D`), RI - Inf branch 80%, Cx-OM moderate caliber,mRCA 80% & RPAV 95% --> CABG   CORONARY ARTERY BYPASS GRAFT  2014   3V - LIMA-LAD, SVG-Inferior RI branch (called OM1), SVG-rPDA   NM MYOVIEW LTD  07/2012   ABNORMAL: Mod Size, Mildly Severe Basal Mid & Apical Inferior reversible defect & ++ GXT portion with limiting Angina --> CATH   TRANSTHORACIC ECHOCARDIOGRAM  07/2012   [facility]: EF > 55%, Dgen MV Dz w/ mild MR, Ao Sclerosis, Mild AI, Ascending Aorta ~4.0-4.2 cm @ sinotubular Jxn     reports that he has never smoked. His smokeless tobacco use includes chew. He reports current alcohol use of about 2.0 standard drinks per week. He reports that he does not use drugs.  Allergies  Allergen Reactions   Pantoprazole     arthralgia    Family History  Problem Relation Age of Onset   Stroke Father    Other Mother        stomach problems     Prior to Admission medications   Medication Sig Start Date End Date Taking? Authorizing Provider  aspirin EC 81 MG tablet Take 81 mg daily by mouth.    [provider]  atorvastatin (LIPITOR) 40 MG tablet Take 40 mg by mouth daily.    [provider]  esomeprazole (NEXIUM) 40 MG capsule Take 40 mg by mouth daily. 12/27/18   [provider]  ezetimibe (ZETIA) 10 MG tablet TAKE 1 TABLET BY MOUTH EVERY DAY 07/09/20    Marykay Lex, MD  isosorbide mononitrate (IMDUR) 30 MG 24 hr tablet Take 1 tablet (30 mg total) by mouth daily. 11/04/14   Hongalgi, Maximino Greenland, MD  metoprolol tartrate (LOPRESSOR) 25 MG tablet TAKE 1 & 1/2 TABLETS BY MOUTH 2 TIMES A DAY. MAY TAKE AN ADDITIONAL 1/2 TABLET IF NEEDED 04/04/18   Marykay Lex, MD  nitroGLYCERIN (NITROSTAT) 0.4 MG SL tablet PLACE 1 TABLET UNDER TONGUE EVERY 5 MINS AS NEEDED FOR CHEST PAIN CALL 911/EMS BY 3RD DOSE 10/26/14   [provider]  zolpidem (AMBIEN) 10 MG tablet 1/2 at bedtime as needed 04/22/15   [provider]    Physical Exam: Constitutional: Moderately built and nourished. Vitals:   09/15/20 0400 09/15/20 0415 09/15/20 0430 09/15/20 0445  BP: 115/78 121/74 120/78 126/76  Pulse: (!) 57 (!) 51 (!) 51 (!) 57  Resp:    18  Temp:      SpO2: 99% 99% 99% 99%   Eyes: Anicteric no pallor. ENMT: No discharge from the ears eyes nose and mouth. Neck: No mass felt.  No neck rigidity. Respiratory: No rhonchi or crepitations. Cardiovascular: S1-S2 heard. Abdomen: Soft nontender bowel sound present. Musculoskeletal: No edema. Skin: No rash. Neurologic: Alert awake oriented to time place and person.  Moves all extremities. Psychiatric: Appears normal.  Normal affect.   Labs on Admission: I have personally reviewed following labs and imaging studies  CBC: Recent Labs  Lab 09/15/20 0226  WBC 4.5  HGB 13.0  HCT 39.1  MCV 96.1  PLT 127*   Basic Metabolic Panel: Recent Labs  Lab 09/15/20 0226  NA 137  K 3.7  CL 107  CO2 24  GLUCOSE 112*  BUN 18  CREATININE 1.04  CALCIUM 8.6*   GFR: CrCl cannot be calculated (Unknown ideal weight.). Liver Function Tests: Recent Labs  Lab 09/15/20 0226  AST 57*  ALT 54*  ALKPHOS 49  BILITOT 0.6  PROT 6.0*  ALBUMIN 3.6   No results for input(s): LIPASE, AMYLASE in the last 168 hours. No results for input(s): AMMONIA in the last 168 hours. Coagulation Profile: No results for  input(s): INR, PROTIME in the last 168 hours. Cardiac Enzymes: No results for input(s): CKTOTAL, CKMB, CKMBINDEX, TROPONINI in the last 168 hours. BNP (last 3 results) No results for input(s): PROBNP in the last 8760 hours. HbA1C: No results for input(s): HGBA1C in the last 72 hours. CBG: No results for input(s): GLUCAP in the last 168 hours. Lipid Profile: No results for input(s): CHOL, HDL, LDLCALC, TRIG, CHOLHDL, LDLDIRECT in the last 72 hours. Thyroid Function Tests: No results for input(s): TSH, T4TOTAL, FREET4, T3FREE, THYROIDAB in the last 72 hours. Anemia Panel: No results for input(s): VITAMINB12, FOLATE, FERRITIN, TIBC, IRON, RETICCTPCT in the last 72 hours. Urine analysis: No results found for: COLORURINE, APPEARANCEUR, LABSPEC, PHURINE, GLUCOSEU,  HGBUR, BILIRUBINUR, KETONESUR, PROTEINUR, UROBILINOGEN, NITRITE, LEUKOCYTESUR Sepsis Labs: @LABRCNTIP (procalcitonin:4,lacticidven:4) )No results found for this or any previous visit (from the past 240 hour(s)).   Radiological Exams on Admission: DG Chest 2 View  Result Date: 09/15/2020 CLINICAL DATA:  Chest pain. EXAM: CHEST - 2 VIEW COMPARISON:  Chest radiograph dated 06/12/2017. FINDINGS: Left lung base streaky atelectasis or scarring. No focal consolidation, pleural effusion, or pneumothorax. Stable cardiomediastinal silhouette. Median sternotomy wires and CABG vascular clips. No acute osseous pathology. IMPRESSION: No active cardiopulmonary disease. Electronically Signed   By: 06/14/2017 M.D.   On: 09/15/2020 02:49    EKG: Independently reviewed.  Normal sinus rhythm with nonspecific changes.  Assessment/Plan Principal Problem:   Chest pain Active Problems:   Elevated LFTs   CAD, multiple vessel   PAF (paroxysmal atrial fibrillation) (HCC)    Chest pain concerning for possible unstable angina given history of CAD.  We will keep patient n.p.o. except medication.  Cardiology has been consulted.  Patient is on aspirin  and statins and Zetia beta-blockers. Elevated LFTs -abdomen appears benign.  Patient's LFTs in 2016 with a significantly much elevated than normal.  We will closely monitor. Thrombocytopenia -appears to be chronic we will closely follow CBC.  COVID test is pending.   DVT prophylaxis: Lovenox. Code Status: Full code. Family Communication: Discussed with patient. Disposition Plan: Home. Consults called: Cardiology. Admission status: Observation.   2017 MD [facility] Pager [phone].  If 7PM-7AM, please contact night-coverage [email or web address] Password [facility]  09/15/2020, 5:57 AM

## 2020-09-15 NOTE — ED Notes (Signed)
Patient signed informed consent.

## 2020-09-15 NOTE — H&P (View-Only) (Signed)
Cardiology Consultation:   Patient ID: Edgar Brooks; [id number]; 1955-04-02   Admit date: 09/15/2020 Date of Consult: 09/15/2020  Primary Care Provider: Lonie Peak, PA-C Primary Cardiologist: Dr. Herbie Baltimore, MD   Patient Profile:   Edgar Brooks is a 65 y.o. male with a hx of CAD s/p CABG 2014 with LIMA-LAD, SVG-OM1, SCG>PDA (patent on last cath 2016), ascending aortic aneurysm at 4.2cm, HLD, HTN, and post-operative atrial fibrillation not on [facility] who is being seen today for the evaluation of chest pain at the request of Dr. Toniann Fail.  History of Present Illness:   Edgar Brooks is a 65yo M with a hx as stated above who presented to [facility] 09/15/20 with chest pain. Patient reports that he was watching TV yesterday evening and developed anterior chest pressure described as a burning sensation. He took a SL NTG with resolution. He then developed a second episode around 1am that was more intense with associated diaphoresis. He took another NTG tablet however with no relief. Given his CAD hx, he called EMS and was brought to the ED for further evaluation. He reports that he had similar symptoms in 2016 which prompted and ED evaluation. He underwent a cardiac cath at that time which showed patent grafts. He states that symptoms yesterday and this morning were worrisome due to the diaphoresis. He does report a hx of GERD however states this has been well controlled for several years and is not reminiscent of symptoms from this morning. He is currently pain free. He denies recent SOB, fatigue, palpitations, orthopnea, LE edea, recent illness with cough, fever or chills.   In the ED, initial HsT found to be 4>4. CXR with no acute cardiopulmonary disease. EKG with no acute ST/TW changes. LFTs mildly elevated at 57/54.    He is followed by Dr. Herbie Baltimore for his cardiology care. He underwent CABG at [facility] in 2014 with Krista Blue, SCG>OM1 and SVG >PDA. His last cath was in 2016 which showed patent grafts. He  had post operative PAF and wore a monitor in 2019 which showed short episodes of AF with a burden at <1%. He was continued on ASA and plans to follow symptoms and consideration of DOAC. He was last seen by Dr. Herbie Baltimore 04/21/20 and was noted to be doing well at that time.   Past Medical History:  Diagnosis Date   Ascending aortic aneurysm (HCC) 07/2012   4.2 cm    Coronary artery disease involving native coronary artery of native heart with angina pectoris (HCC) 07/19/2012;; 10/2014   [facility]) Abnormal Nuclear ST --> Prox LAD ~75% calcified, RI - large & bifurcating w/ 90% lesion in medial (Diag) territory, Cx- small OM & PLB, RCA 80% mid --> s/p CABG x 3 at [facility] 2014;; b. CATH ([facility]) Patent Grafts, mLAD (after sm D1) 100%, Inf Branch of RI 100%, Superior RI branch (non-grafted) tandem ~80 & 65%, mRCA 100%, Cx mod Dz, dRCA - RPAV severe 90+%.   Hyperlipidemia LDL goal <70    Postoperative atrial fibrillation (HCC) 08/2012   Post-op CABG - Rx wiht Amiodarone; no reported recurrence.   S/P CABG x 3 08/22/2012   [facility] - Dr. Remo Lipps) LIMA-LAD, SVG-OM1 (Inf Branch of [place]), SVG-PDA    Past Surgical History:  Procedure Laterality Date   CARDIAC CATHETERIZATION [name] 11/03/2014   Procedure: Left Heart Cath and Cors/Grafts Angiography;  Surgeon: Marykay Lex, MD;  Location: [facility] INVASIVE CV LAB;  Service: Cardiovascular;  mLAD (after sm D1) 100%, OM1/infRI - 100%, SupRI (  non-grafted) 80 & 65%, Cx-OM diffuse mode Dz, mRCA 100% & RPAV 95%; Patent LIMA-LAD, SVG-RPDA, SVG-RI(OM)   CARDIAC CATHETERIZATION  07/2012   [facility]: 75% mLAD (after sm D`), RI - Inf branch 80%, Cx-OM moderate caliber,mRCA 80% & RPAV 95% --> CABG   CORONARY ARTERY BYPASS GRAFT  2014   3V - LIMA-LAD, SVG-Inferior RI branch (called OM1), SVG-rPDA   NM MYOVIEW LTD  07/2012   ABNORMAL: Mod Size, Mildly Severe Basal Mid & Apical Inferior reversible defect & ++ GXT portion with limiting Angina --> CATH   TRANSTHORACIC ECHOCARDIOGRAM  07/2012   [facility]: EF > 55%,  Dgen MV Dz w/ mild MR, Ao Sclerosis, Mild AI, Ascending Aorta ~4.0-4.2 cm @ sinotubular Jxn     Prior to Admission medications   Medication Sig Start Date End Date Taking? Authorizing Provider  aspirin EC 81 MG tablet Take 81 mg daily by mouth.   Yes [provider]  atorvastatin (LIPITOR) 40 MG tablet Take 40 mg by mouth daily.   Yes [provider]  esomeprazole (NEXIUM) 40 MG capsule Take 40 mg by mouth daily. 12/27/18  Yes [provider]  ezetimibe (ZETIA) 10 MG tablet TAKE 1 TABLET BY MOUTH EVERY DAY 07/09/20  Yes Marykay Lex, MD  isosorbide mononitrate (IMDUR) 30 MG 24 hr tablet Take 1 tablet (30 mg total) by mouth daily. 11/04/14  Yes Hongalgi, Maximino Greenland, MD  metoprolol tartrate (LOPRESSOR) 25 MG tablet TAKE 1 & 1/2 TABLETS BY MOUTH 2 TIMES A DAY. MAY TAKE AN ADDITIONAL 1/2 TABLET IF NEEDED Patient taking differently: Take 37.5 mg by mouth See admin instructions. 37.5 mg twice daily. May take an additional 12.5 mg if needed for HR 04/04/18  Yes Marykay Lex, MD  Multiple Vitamins-Minerals (MULTI-DAY PLUS MINERALS) TABS Take 1 tablet by mouth daily.   Yes [provider]  nitroGLYCERIN (NITROSTAT) 0.4 MG SL tablet Place 0.4 mg under the tongue every 5 (five) minutes as needed for chest pain. 10/26/14  Yes [provider]  pyridOXINE (VITAMIN B-6) 50 MG tablet Take 50 mg by mouth daily.   Yes [provider]  zolpidem (AMBIEN) 10 MG tablet Take 5-10 mg by mouth at bedtime as needed for sleep. 04/22/15  Yes [provider]    Inpatient Medications: Scheduled Meds:  aspirin EC  81 mg Oral Daily   atorvastatin  40 mg Oral Daily   enoxaparin (LOVENOX) injection  40 mg Subcutaneous Daily   ezetimibe  10 mg Oral Daily   isosorbide mononitrate  30 mg Oral Daily   metoprolol tartrate  37.5 mg Oral BID   pantoprazole  40 mg Oral Daily   Continuous Infusions:  PRN Meds: morphine injection, nitroGLYCERIN,  zolpidem  Allergies:    Allergies  Allergen Reactions   Pantoprazole     arthralgia    Social History:   Social History   Socioeconomic History   Marital status: Married    Spouse name: Not on file   Number of children: Not on file   Years of education: Not on file   Highest education level: Not on file  Occupational History   Not on file  Tobacco Use   Smoking status: Never   Smokeless tobacco: Current    Types: Chew  Substance and Sexual Activity   Alcohol use: Yes    Alcohol/week: 2.0 standard drinks    Types: 2 Cans of beer per week    Comment: history of heavy ETOH abuse, 1/2 gallon of  whiskey every 2 days   Drug use: No   Sexual activity: Not on file  Other Topics Concern   Not on file  Social History Narrative   Not on file   Social Determinants of Health   Financial Resource Strain: Not on file  Food Insecurity: Not on file  Transportation Needs: Not on file  Physical Activity: Not on file  Stress: Not on file  Social Connections: Not on file  Intimate Partner Violence: Not on file    Family History:   Family History  Problem Relation Age of Onset   Stroke Father    Other Mother        stomach problems    Family Status:  Family Status  Relation Name Status   Father  Deceased   Mother  Deceased   Sister  Alive   MGM  Alive   MGF  Alive   PGM  Alive   PGF  Alive   Sister  Alive   Son  Alive   Daughter  Alive    ROS:  Please see the history of present illness.  All other ROS reviewed and negative.     Physical Exam/Data:   Vitals:   09/15/20 0445 09/15/20 0800 09/15/20 0947 09/15/20 1030  BP: 126/76 124/73 (!) 143/97 113/69  Pulse: (!) 57 (!) 51 68 78  Resp: 18 15  (!) 26  Temp:      SpO2: 99% 99%  99%   No intake or output data in the 24 hours ending 09/15/20 1137 There were no vitals filed for this visit. There is no height or weight on file to calculate BMI.   General: Well developed, well nourished, NAD Neck: Negative for  carotid bruits. No JVD Lungs:Clear to ausculation bilaterally. No wheezes, rales, or rhonchi. Breathing is unlabored. Cardiovascular: RRR with S1 S2. No murmurs Abdomen: Soft, non-tender, non-distended. No obvious abdominal masses. Extremities: No edema. Radial pulses 2+ bilaterally Neuro: Alert and oriented. No focal deficits. No facial asymmetry. MAE spontaneously. Psych: Responds to questions appropriately with normal affect.    EKG:  The EKG was personally reviewed and demonstrates: 09/15/20 SB with HR 56bpm. Possible ST changes in lead III however no other consecutive leads. Telemetry:  Telemetry was personally reviewed and demonstrates: 09/15/20 SB with HR in the 50's   Relevant CV Studies:  Cardiac Cath 10/2014 - 3/3 grafts patent.  Severe occlusive native disease.        Laboratory Data:  Chemistry Recent Labs  Lab 09/15/20 0226 09/15/20 0556  NA 137  --   K 3.7  --   CL 107  --   CO2 24  --   GLUCOSE 112*  --   BUN 18  --   CREATININE 1.04 1.02  CALCIUM 8.6*  --   GFRNONAA >60 >60  ANIONGAP 6  --     Total Protein  Date Value Ref Range Status  09/15/2020 6.0 (L) 6.5 - 8.1 g/dL Final   Albumin  Date Value Ref Range Status  09/15/2020 3.6 3.5 - 5.0 g/dL Final   AST  Date Value Ref Range Status  09/15/2020 57 (H) 15 - 41 U/L Final   ALT  Date Value Ref Range Status  09/15/2020 54 (H) 0 - 44 U/L Final   Alkaline Phosphatase  Date Value Ref Range Status  09/15/2020 49 38 - 126 U/L Final   Total Bilirubin  Date Value Ref Range Status  09/15/2020 0.6 0.3 - 1.2 mg/dL Final  Hematology Recent Labs  Lab 09/15/20 0226 09/15/20 0556  WBC 4.5 7.2  RBC 4.07* 4.20*  HGB 13.0 13.6  HCT 39.1 39.7  MCV 96.1 94.5  MCH 31.9 32.4  MCHC 33.2 34.3  RDW 12.5 12.6  PLT 127* 135*   Cardiac EnzymesNo results for input(s): TROPONINI in the last 168 hours. No results for input(s): TROPIPOC in the last 168 hours.  BNPNo results for input(s): BNP, PROBNP in the  last 168 hours.  DDimer No results for input(s): DDIMER in the last 168 hours. TSH:  Lab Results  Component Value Date   TSH 1.19 05/27/2015   Lipids:No results found for: CHOL, HDL, LDLCALC, LDLDIRECT, TRIG, CHOLHDL HgbA1c:No results found for: HGBA1C  Radiology/Studies:  DG Chest 2 View  Result Date: 09/15/2020 CLINICAL DATA:  Chest pain. EXAM: CHEST - 2 VIEW COMPARISON:  Chest radiograph dated 06/12/2017. FINDINGS: Left lung base streaky atelectasis or scarring. No focal consolidation, pleural effusion, or pneumothorax. Stable cardiomediastinal silhouette. Median sternotomy wires and CABG vascular clips. No acute osseous pathology. IMPRESSION: No active cardiopulmonary disease. Electronically Signed   By: Elgie CollardArash  Radparvar M.D.   On: 09/15/2020 02:49    Assessment and Plan:   1. Chest pain with known CAD s/p CABG 2014: -Pt presented after developing chest pain while watching TV yesterday evening and developed anterior chest pressure described as a burning sensation. He took a SL NTG with resolution. He then developed a second episode around 1am that was more intense with associated diaphoresis. He took another NTG tablet however with no relief.  ->  However he did have relief after second nitroglycerin and aspirin. => 2 episodes of resting chest pain with relief using nitroglycerin and aspirin-concerning for unstable angina. -He reports that he had similar symptoms in 2016 which prompted and ED evaluation --however at that time, symptoms were not relieved with nitroglycerin or aspirin.[name]. He underwent a cardiac cath at that time which showed patent grafts.  -HsT found to be 4>4.  -CXR with no acute cardiopulmonary disease.  -EKG with no acute ST/TW changes.  -LFTs mildly elevated at 57/54 -Given duration since last ischemic evaluation, severe native disease-graft dependency with symptoms concerning for unstable angina, will plan cardiac catheterization today. -Continue ASA -Hold statin due  to elevated AST/ALT -May need to reduce beta blocker to 25mg  however will defer to MD given that it is his patient  -> we will monitor heart rate while in the hospital. -The patient understands that risks include but are not limited to stroke (1 in 1000), death (1 in 1000), kidney failure [usually temporary] (1 in 500), bleeding (1 in 200), allergic reaction [possibly serious] (1 in 200), and agrees to proceed.    2. Elevated LFTs: -Elevated at 57/54 -Hold statin therapy -> > pending cardiac evaluation, if no evidence of new stenoses, may want to consider gallbladder/liver evaluation.  3. Aortic aneurysm: -Would re-assess while inpatient>>>previously noted to measure 42 (checked on echocardiogram--reviewed, but read still pending.  Does not appear to be any enlarged.). -Keep BP at goal   4. HTN: -Stable, 112/72>>113/69>>124/73 -Continue current regimen   5. HLD: -Hold statin due to elevated AST/ALT -Recheck in the OP setting   6. PAF: -Patient has a hx of post operative AF. He wore a monitor in 2019 which showed short episodes of AF however noted to be less than 1%. H -No recurrent palpitations -Continue ASA for now and if recurrent symptoms, then can reassess with ZIO  For questions or updates, please contact  [facility] Please consult [email or web address] for contact info under Cardiology/STEMI.   Signed, Jill McDaniel NP-C [facility] Pager: [phone] 09/15/2020 11:37 AM   ATTENDING ATTESTATION  I have seen, examined and evaluated the patient this PM in ER along with Jill McDaniel, NP-C.  After reviewing all the available data and chart, we discussed the patients laboratory, study & physical findings as well as symptoms in detail. I agree with her findings, examination as well as impression recommendations as per our discussion.    Attending adjustments noted in italics.   Bobby Wellborn is well-known to me.  He has been doing relatively well for the last 6 years with no  significant symptoms, now presents with 2 episodes of resting chest pain alleviated with nitroglycerin initially and then aspirin nitroglycerin with a second more significant episode.  He has not ruled in for MI, however I do think ischemic evaluation is warranted.  In light of his existing negative disease I do not think a stress test would be all that helpful and therefore I think invasive valuation cardiac catheterization is warranted.  Will plan for cardiac catheterization today.  Shared Decision Making/Informed Consent{  The risks [stroke (1 in 1000), death (1 in 1000), kidney failure [usually temporary] (1 in 500), bleeding (1 in 200), allergic reaction [possibly serious] (1 in 200)], benefits (diagnostic support and management of coronary artery disease) and alternatives of a cardiac catheterization were discussed in detail with Mr. Hardrick and he is willing to proceed.     Suri Tafolla, M.D., M.S. Interventional Cardiologist   Pager # [phone] Phone # [phone] [address] [place], [place] [address]      

## 2020-09-15 NOTE — Consult Note (Signed)
Cardiology Consultation:   Patient ID: Edgar Brooks; [id number]; 1955-04-02   Admit date: 09/15/2020 Date of Consult: 09/15/2020  Primary Care Provider: Lonie Peak, PA-C Primary Cardiologist: Dr. Herbie Baltimore, MD   Patient Profile:   Edgar Brooks is a 65 y.o. male with a hx of CAD s/p CABG 2014 with LIMA-LAD, SVG-OM1, SCG>PDA (patent on last cath 2016), ascending aortic aneurysm at 4.2cm, HLD, HTN, and post-operative atrial fibrillation not on [facility] who is being seen today for the evaluation of chest pain at the request of Dr. Toniann Fail.  History of Present Illness:   Mr. Edgar Brooks is a 65yo M with a hx as stated above who presented to [facility] 09/15/20 with chest pain. Patient reports that he was watching TV yesterday evening and developed anterior chest pressure described as a burning sensation. He took a SL NTG with resolution. He then developed a second episode around 1am that was more intense with associated diaphoresis. He took another NTG tablet however with no relief. Given his CAD hx, he called EMS and was brought to the ED for further evaluation. He reports that he had similar symptoms in 2016 which prompted and ED evaluation. He underwent a cardiac cath at that time which showed patent grafts. He states that symptoms yesterday and this morning were worrisome due to the diaphoresis. He does report a hx of GERD however states this has been well controlled for several years and is not reminiscent of symptoms from this morning. He is currently pain free. He denies recent SOB, fatigue, palpitations, orthopnea, LE edea, recent illness with cough, fever or chills.   In the ED, initial HsT found to be 4>4. CXR with no acute cardiopulmonary disease. EKG with no acute ST/TW changes. LFTs mildly elevated at 57/54.    He is followed by Dr. Herbie Baltimore for his cardiology care. He underwent CABG at [facility] in 2014 with Krista Blue, SCG>OM1 and SVG >PDA. His last cath was in 2016 which showed patent grafts. He  had post operative PAF and wore a monitor in 2019 which showed short episodes of AF with a burden at <1%. He was continued on ASA and plans to follow symptoms and consideration of DOAC. He was last seen by Dr. Herbie Baltimore 04/21/20 and was noted to be doing well at that time.   Past Medical History:  Diagnosis Date   Ascending aortic aneurysm (HCC) 07/2012   4.2 cm    Coronary artery disease involving native coronary artery of native heart with angina pectoris (HCC) 07/19/2012;; 10/2014   [facility]) Abnormal Nuclear ST --> Prox LAD ~75% calcified, RI - large & bifurcating w/ 90% lesion in medial (Diag) territory, Cx- small OM & PLB, RCA 80% mid --> s/p CABG x 3 at [facility] 2014;; b. CATH ([facility]) Patent Grafts, mLAD (after sm D1) 100%, Inf Branch of RI 100%, Superior RI branch (non-grafted) tandem ~80 & 65%, mRCA 100%, Cx mod Dz, dRCA - RPAV severe 90+%.   Hyperlipidemia LDL goal <70    Postoperative atrial fibrillation (HCC) 08/2012   Post-op CABG - Rx wiht Amiodarone; no reported recurrence.   S/P CABG x 3 08/22/2012   [facility] - Dr. Remo Lipps) LIMA-LAD, SVG-OM1 (Inf Branch of [place]), SVG-PDA    Past Surgical History:  Procedure Laterality Date   CARDIAC CATHETERIZATION [name] 11/03/2014   Procedure: Left Heart Cath and Cors/Grafts Angiography;  Surgeon: Marykay Lex, MD;  Location: [facility] INVASIVE CV LAB;  Service: Cardiovascular;  mLAD (after sm D1) 100%, OM1/infRI - 100%, SupRI (  non-grafted) 80 & 65%, Cx-OM diffuse mode Dz, mRCA 100% & RPAV 95%; Patent LIMA-LAD, SVG-RPDA, SVG-RI(OM)   CARDIAC CATHETERIZATION  07/2012   [facility]: 75% mLAD (after sm D`), RI - Inf branch 80%, Cx-OM moderate caliber,mRCA 80% & RPAV 95% --> CABG   CORONARY ARTERY BYPASS GRAFT  2014   3V - LIMA-LAD, SVG-Inferior RI branch (called OM1), SVG-rPDA   NM MYOVIEW LTD  07/2012   ABNORMAL: Mod Size, Mildly Severe Basal Mid & Apical Inferior reversible defect & ++ GXT portion with limiting Angina --> CATH   TRANSTHORACIC ECHOCARDIOGRAM  07/2012   [facility]: EF > 55%,  Dgen MV Dz w/ mild MR, Ao Sclerosis, Mild AI, Ascending Aorta ~4.0-4.2 cm @ sinotubular Jxn     Prior to Admission medications   Medication Sig Start Date End Date Taking? Authorizing Provider  aspirin EC 81 MG tablet Take 81 mg daily by mouth.   Yes [provider]  atorvastatin (LIPITOR) 40 MG tablet Take 40 mg by mouth daily.   Yes [provider]  esomeprazole (NEXIUM) 40 MG capsule Take 40 mg by mouth daily. 12/27/18  Yes [provider]  ezetimibe (ZETIA) 10 MG tablet TAKE 1 TABLET BY MOUTH EVERY DAY 07/09/20  Yes Marykay Lex, MD  isosorbide mononitrate (IMDUR) 30 MG 24 hr tablet Take 1 tablet (30 mg total) by mouth daily. 11/04/14  Yes Hongalgi, Maximino Greenland, MD  metoprolol tartrate (LOPRESSOR) 25 MG tablet TAKE 1 & 1/2 TABLETS BY MOUTH 2 TIMES A DAY. MAY TAKE AN ADDITIONAL 1/2 TABLET IF NEEDED Patient taking differently: Take 37.5 mg by mouth See admin instructions. 37.5 mg twice daily. May take an additional 12.5 mg if needed for HR 04/04/18  Yes Marykay Lex, MD  Multiple Vitamins-Minerals (MULTI-DAY PLUS MINERALS) TABS Take 1 tablet by mouth daily.   Yes [provider]  nitroGLYCERIN (NITROSTAT) 0.4 MG SL tablet Place 0.4 mg under the tongue every 5 (five) minutes as needed for chest pain. 10/26/14  Yes [provider]  pyridOXINE (VITAMIN B-6) 50 MG tablet Take 50 mg by mouth daily.   Yes [provider]  zolpidem (AMBIEN) 10 MG tablet Take 5-10 mg by mouth at bedtime as needed for sleep. 04/22/15  Yes [provider]    Inpatient Medications: Scheduled Meds:  aspirin EC  81 mg Oral Daily   atorvastatin  40 mg Oral Daily   enoxaparin (LOVENOX) injection  40 mg Subcutaneous Daily   ezetimibe  10 mg Oral Daily   isosorbide mononitrate  30 mg Oral Daily   metoprolol tartrate  37.5 mg Oral BID   pantoprazole  40 mg Oral Daily   Continuous Infusions:  PRN Meds: morphine injection, nitroGLYCERIN,  zolpidem  Allergies:    Allergies  Allergen Reactions   Pantoprazole     arthralgia    Social History:   Social History   Socioeconomic History   Marital status: Married    Spouse name: Not on file   Number of children: Not on file   Years of education: Not on file   Highest education level: Not on file  Occupational History   Not on file  Tobacco Use   Smoking status: Never   Smokeless tobacco: Current    Types: Chew  Substance and Sexual Activity   Alcohol use: Yes    Alcohol/week: 2.0 standard drinks    Types: 2 Cans of beer per week    Comment: history of heavy ETOH abuse, 1/2 gallon of  whiskey every 2 days   Drug use: No   Sexual activity: Not on file  Other Topics Concern   Not on file  Social History Narrative   Not on file   Social Determinants of Health   Financial Resource Strain: Not on file  Food Insecurity: Not on file  Transportation Needs: Not on file  Physical Activity: Not on file  Stress: Not on file  Social Connections: Not on file  Intimate Partner Violence: Not on file    Family History:   Family History  Problem Relation Age of Onset   Stroke Father    Other Mother        stomach problems    Family Status:  Family Status  Relation Name Status   Father  Deceased   Mother  Deceased   Sister  Alive   MGM  Alive   MGF  Alive   PGM  Alive   PGF  Alive   Sister  Alive   Son  Alive   Daughter  Alive    ROS:  Please see the history of present illness.  All other ROS reviewed and negative.     Physical Exam/Data:   Vitals:   09/15/20 0445 09/15/20 0800 09/15/20 0947 09/15/20 1030  BP: 126/76 124/73 (!) 143/97 113/69  Pulse: (!) 57 (!) 51 68 78  Resp: 18 15  (!) 26  Temp:      SpO2: 99% 99%  99%   No intake or output data in the 24 hours ending 09/15/20 1137 There were no vitals filed for this visit. There is no height or weight on file to calculate BMI.   General: Well developed, well nourished, NAD Neck: Negative for  carotid bruits. No JVD Lungs:Clear to ausculation bilaterally. No wheezes, rales, or rhonchi. Breathing is unlabored. Cardiovascular: RRR with S1 S2. No murmurs Abdomen: Soft, non-tender, non-distended. No obvious abdominal masses. Extremities: No edema. Radial pulses 2+ bilaterally Neuro: Alert and oriented. No focal deficits. No facial asymmetry. MAE spontaneously. Psych: Responds to questions appropriately with normal affect.    EKG:  The EKG was personally reviewed and demonstrates: 09/15/20 SB with HR 56bpm. Possible ST changes in lead III however no other consecutive leads. Telemetry:  Telemetry was personally reviewed and demonstrates: 09/15/20 SB with HR in the 50's   Relevant CV Studies:  Cardiac Cath 10/2014 - 3/3 grafts patent.  Severe occlusive native disease.        Laboratory Data:  Chemistry Recent Labs  Lab 09/15/20 0226 09/15/20 0556  NA 137  --   K 3.7  --   CL 107  --   CO2 24  --   GLUCOSE 112*  --   BUN 18  --   CREATININE 1.04 1.02  CALCIUM 8.6*  --   GFRNONAA >60 >60  ANIONGAP 6  --     Total Protein  Date Value Ref Range Status  09/15/2020 6.0 (L) 6.5 - 8.1 g/dL Final   Albumin  Date Value Ref Range Status  09/15/2020 3.6 3.5 - 5.0 g/dL Final   AST  Date Value Ref Range Status  09/15/2020 57 (H) 15 - 41 U/L Final   ALT  Date Value Ref Range Status  09/15/2020 54 (H) 0 - 44 U/L Final   Alkaline Phosphatase  Date Value Ref Range Status  09/15/2020 49 38 - 126 U/L Final   Total Bilirubin  Date Value Ref Range Status  09/15/2020 0.6 0.3 - 1.2 mg/dL Final  Hematology Recent Labs  Lab 09/15/20 0226 09/15/20 0556  WBC 4.5 7.2  RBC 4.07* 4.20*  HGB 13.0 13.6  HCT 39.1 39.7  MCV 96.1 94.5  MCH 31.9 32.4  MCHC 33.2 34.3  RDW 12.5 12.6  PLT 127* 135*   Cardiac EnzymesNo results for input(s): TROPONINI in the last 168 hours. No results for input(s): TROPIPOC in the last 168 hours.  BNPNo results for input(s): BNP, PROBNP in the  last 168 hours.  DDimer No results for input(s): DDIMER in the last 168 hours. TSH:  Lab Results  Component Value Date   TSH 1.19 05/27/2015   Lipids:No results found for: CHOL, HDL, LDLCALC, LDLDIRECT, TRIG, CHOLHDL HgbA1c:No results found for: HGBA1C  Radiology/Studies:  DG Chest 2 View  Result Date: 09/15/2020 CLINICAL DATA:  Chest pain. EXAM: CHEST - 2 VIEW COMPARISON:  Chest radiograph dated 06/12/2017. FINDINGS: Left lung base streaky atelectasis or scarring. No focal consolidation, pleural effusion, or pneumothorax. Stable cardiomediastinal silhouette. Median sternotomy wires and CABG vascular clips. No acute osseous pathology. IMPRESSION: No active cardiopulmonary disease. Electronically Signed   By: Elgie CollardArash  Radparvar M.D.   On: 09/15/2020 02:49    Assessment and Plan:   1. Chest pain with known CAD s/p CABG 2014: -Pt presented after developing chest pain while watching TV yesterday evening and developed anterior chest pressure described as a burning sensation. He took a SL NTG with resolution. He then developed a second episode around 1am that was more intense with associated diaphoresis. He took another NTG tablet however with no relief.  ->  However he did have relief after second nitroglycerin and aspirin. => 2 episodes of resting chest pain with relief using nitroglycerin and aspirin-concerning for unstable angina. -He reports that he had similar symptoms in 2016 which prompted and ED evaluation --however at that time, symptoms were not relieved with nitroglycerin or aspirin.[name]. He underwent a cardiac cath at that time which showed patent grafts.  -HsT found to be 4>4.  -CXR with no acute cardiopulmonary disease.  -EKG with no acute ST/TW changes.  -LFTs mildly elevated at 57/54 -Given duration since last ischemic evaluation, severe native disease-graft dependency with symptoms concerning for unstable angina, will plan cardiac catheterization today. -Continue ASA -Hold statin due  to elevated AST/ALT -May need to reduce beta blocker to 25mg  however will defer to MD given that it is his patient  -> we will monitor heart rate while in the hospital. -The patient understands that risks include but are not limited to stroke (1 in 1000), death (1 in 1000), kidney failure [usually temporary] (1 in 500), bleeding (1 in 200), allergic reaction [possibly serious] (1 in 200), and agrees to proceed.    2. Elevated LFTs: -Elevated at 57/54 -Hold statin therapy -> > pending cardiac evaluation, if no evidence of new stenoses, may want to consider gallbladder/liver evaluation.  3. Aortic aneurysm: -Would re-assess while inpatient>>>previously noted to measure 42 (checked on echocardiogram--reviewed, but read still pending.  Does not appear to be any enlarged.). -Keep BP at goal   4. HTN: -Stable, 112/72>>113/69>>124/73 -Continue current regimen   5. HLD: -Hold statin due to elevated AST/ALT -Recheck in the OP setting   6. PAF: -Patient has a hx of post operative AF. He wore a monitor in 2019 which showed short episodes of AF however noted to be less than 1%. H -No recurrent palpitations -Continue ASA for now and if recurrent symptoms, then can reassess with ZIO  For questions or updates, please contact  [facility] Please consult [email or web address] for contact info under Cardiology/STEMI.   SignedGeorgie Chard NP-C [facility] Pager: [phone] 09/15/2020 11:37 AM   ATTENDING ATTESTATION  I have seen, examined and evaluated the patient this PM in ER along with Georgie Chard, NP-C.  After reviewing all the available data and chart, we discussed the patients laboratory, study & physical findings as well as symptoms in detail. I agree with her findings, examination as well as impression recommendations as per our discussion.    Attending adjustments noted in italics.   [employer] Melia is well-known to me.  He has been doing relatively well for the last 6 years with no  significant symptoms, now presents with 2 episodes of resting chest pain alleviated with nitroglycerin initially and then aspirin nitroglycerin with a second more significant episode.  He has not ruled in for MI, however I do think ischemic evaluation is warranted.  In light of his existing negative disease I do not think a stress test would be all that helpful and therefore I think invasive valuation cardiac catheterization is warranted.  Will plan for cardiac catheterization today.  Shared Decision Making/Informed Consent{  The risks [stroke (1 in 1000), death (1 in 1000), kidney failure [usually temporary] (1 in 500), bleeding (1 in 200), allergic reaction [possibly serious] (1 in 200)], benefits (diagnostic support and management of coronary artery disease) and alternatives of a cardiac catheterization were discussed in detail with Mr. Bartow and he is willing to proceed.     Bryan Lemma, M.D., M.S. Interventional Cardiologist   Pager # [phone] Phone # [phone] [address] [place], [place] [address]

## 2020-09-16 ENCOUNTER — Other Ambulatory Visit: Payer: Self-pay

## 2020-09-16 ENCOUNTER — Other Ambulatory Visit (HOSPITAL_COMMUNITY): Payer: Self-pay

## 2020-09-16 ENCOUNTER — Encounter (HOSPITAL_COMMUNITY): Payer: Self-pay | Admitting: Interventional Cardiology

## 2020-09-16 DIAGNOSIS — I25708 Atherosclerosis of coronary artery bypass graft(s), unspecified, with other forms of angina pectoris: Secondary | ICD-10-CM

## 2020-09-16 DIAGNOSIS — K219 Gastro-esophageal reflux disease without esophagitis: Secondary | ICD-10-CM | POA: Diagnosis present

## 2020-09-16 DIAGNOSIS — Z79899 Other long term (current) drug therapy: Secondary | ICD-10-CM | POA: Diagnosis not present

## 2020-09-16 DIAGNOSIS — Z955 Presence of coronary angioplasty implant and graft: Secondary | ICD-10-CM

## 2020-09-16 DIAGNOSIS — I712 Thoracic aortic aneurysm, without rupture: Secondary | ICD-10-CM | POA: Diagnosis present

## 2020-09-16 DIAGNOSIS — D6959 Other secondary thrombocytopenia: Secondary | ICD-10-CM | POA: Diagnosis present

## 2020-09-16 DIAGNOSIS — I480 Paroxysmal atrial fibrillation: Secondary | ICD-10-CM | POA: Diagnosis present

## 2020-09-16 DIAGNOSIS — E785 Hyperlipidemia, unspecified: Secondary | ICD-10-CM | POA: Diagnosis present

## 2020-09-16 DIAGNOSIS — Z20822 Contact with and (suspected) exposure to covid-19: Secondary | ICD-10-CM | POA: Diagnosis present

## 2020-09-16 DIAGNOSIS — Z7982 Long term (current) use of aspirin: Secondary | ICD-10-CM | POA: Diagnosis not present

## 2020-09-16 DIAGNOSIS — I200 Unstable angina: Secondary | ICD-10-CM | POA: Diagnosis present

## 2020-09-16 DIAGNOSIS — I25110 Atherosclerotic heart disease of native coronary artery with unstable angina pectoris: Secondary | ICD-10-CM | POA: Diagnosis present

## 2020-09-16 DIAGNOSIS — I10 Essential (primary) hypertension: Secondary | ICD-10-CM | POA: Diagnosis present

## 2020-09-16 DIAGNOSIS — F17220 Nicotine dependence, chewing tobacco, uncomplicated: Secondary | ICD-10-CM | POA: Diagnosis present

## 2020-09-16 DIAGNOSIS — Z951 Presence of aortocoronary bypass graft: Secondary | ICD-10-CM | POA: Diagnosis not present

## 2020-09-16 DIAGNOSIS — R079 Chest pain, unspecified: Secondary | ICD-10-CM | POA: Diagnosis not present

## 2020-09-16 LAB — CBC
HCT: 39.1 % (ref 39.0–52.0)
Hemoglobin: 13.2 g/dL (ref 13.0–17.0)
MCH: 31.7 pg (ref 26.0–34.0)
MCHC: 33.8 g/dL (ref 30.0–36.0)
MCV: 94 fL (ref 80.0–100.0)
Platelets: 123 10*3/uL — ABNORMAL LOW (ref 150–400)
RBC: 4.16 MIL/uL — ABNORMAL LOW (ref 4.22–5.81)
RDW: 12.6 % (ref 11.5–15.5)
WBC: 5.6 10*3/uL (ref 4.0–10.5)
nRBC: 0 % (ref 0.0–0.2)

## 2020-09-16 LAB — BASIC METABOLIC PANEL
Anion gap: 10 (ref 5–15)
BUN: 15 mg/dL (ref 8–23)
CO2: 26 mmol/L (ref 22–32)
Calcium: 8.8 mg/dL — ABNORMAL LOW (ref 8.9–10.3)
Chloride: 103 mmol/L (ref 98–111)
Creatinine, Ser: 1.04 mg/dL (ref 0.61–1.24)
GFR, Estimated: >60 mL/min (ref >60)
Glucose, Bld: 103 mg/dL — ABNORMAL HIGH (ref 70–99)
Potassium: 3.9 mmol/L (ref 3.5–5.1)
Sodium: 139 mmol/L (ref 135–145)

## 2020-09-16 SURGERY — CORONARY ATHERECTOMY
Anesthesia: LOCAL

## 2020-09-16 MED ORDER — TICAGRELOR 90 MG PO TABS: 90.0000 mg | ORAL_TABLET | Freq: Two times a day (BID) | ORAL | 12 refills | Status: DC

## 2020-09-16 MED FILL — Nitroglycerin IV Soln 100 MCG/ML in D5W: INTRA_ARTERIAL | Qty: 10 | Status: AC

## 2020-09-16 NOTE — Discharge Summary (Signed)
Physician Discharge Summary  Edgar Brooks UJW:[id number] DOB: June 16, 1955 DOA: 09/15/2020  PCP: Lonie Peak, PA-C  Admit date: 09/15/2020 Discharge date: 09/16/2020  Admitted From: Home Disposition:  Home  Discharge Condition:Stable CODE STATUS:FULL Diet recommendation: Heart Healthy   Brief/Interim Summary:  HPI( as per Dr Toniann Fail) Edgar Brooks is a 65 y.o. male with history of CAD status post CABG, ascending aortic aneurysm, paroxysmal atrial fibrillation presents to the ER with complaints of having chest pain.  Patient states last night around 8 PM while watching television patient started developing central chest pain more like burning sensation.  Patient took some sublingual nitroglycerin following which his chest pain resolved.  Later in the night around 1 PM patient started having chest pain more intense at this time and became diaphoretic.  He took nitroglycerin again but no relief so he called EMS who advised him to take aspirin.  He took aspirin and his chest pain resolved.  Denies any associated nausea vomiting abdominal pain or shortness of breath.   ED Course: In the ER patient was chest pain-free.  High sensitive troponins were negative chest x-ray unremarkable.  Labs show mildly elevated LFTs.  EKG shows nonspecific changes.  ER physician discussed with on-call cardiologist.  Patient admitted for possible unstable angina.  Hospital course:  Hospital course remained stable.  Troponins were normal.  Unstable angina suspected after cardiology evaluation.  Echo showed EF of 55 to 60%, no wall motion abnormality. Underwent cardiac cath with finding of multivessel disease.  Underwent PCI of SVG to PDA for 75% lesion.  Started on Brilinta.  Plan is to continue aspirin ,brilingta for a year then continue on Plavix monotherapy given diffuse disease.  He will continue statin, Zetia, beta-blocker, Imdur.   He is medically stable for discharge home today.  Cardiology cleared for  discharge.  Discharge Diagnoses:  Principal Problem:   Chest pain Active Problems:   Unstable angina (HCC)   Elevated LFTs   CAD, multiple vessel   PAF (paroxysmal atrial fibrillation) Edgar Brooks)    Discharge Instructions  Discharge Instructions     Amb Referral to Cardiac Rehabilitation   Complete by: As directed    Referring to [facility] CRP 2   Diagnosis: Coronary Stents   After initial evaluation and assessments completed: Virtual Based Care may be provided alone or in conjunction with Phase 2 Cardiac Rehab based on patient barriers.: Yes   Diet - low sodium heart healthy   Complete by: As directed    Discharge instructions   Complete by: As directed    1)Please take prescribed medications as instructed 2)You will be called by cardiology for follow-up appointment   Increase activity slowly   Complete by: As directed       Allergies as of 09/16/2020       Reactions   Pantoprazole    arthralgia        Medication List     TAKE these medications    aspirin EC 81 MG tablet Take 81 mg daily by mouth.   atorvastatin 40 MG tablet Commonly known as: LIPITOR Take 40 mg by mouth daily.   esomeprazole 40 MG capsule Commonly known as: NEXIUM Take 40 mg by mouth daily.   ezetimibe 10 MG tablet Commonly known as: ZETIA TAKE 1 TABLET BY MOUTH EVERY DAY   isosorbide mononitrate 30 MG 24 hr tablet Commonly known as: IMDUR Take 1 tablet (30 mg total) by mouth daily.   metoprolol tartrate 25 MG tablet Commonly known  as: LOPRESSOR TAKE 1 & 1/2 TABLETS BY MOUTH 2 TIMES A DAY. MAY TAKE AN ADDITIONAL 1/2 TABLET IF NEEDED What changed: See the new instructions.   Multi-Day Plus Minerals Tabs Take 1 tablet by mouth daily.   nitroGLYCERIN 0.4 MG SL tablet Commonly known as: NITROSTAT Place 0.4 mg under the tongue every 5 (five) minutes as needed for chest pain.   pyridOXINE 50 MG tablet Commonly known as: VITAMIN B-6 Take 50 mg by mouth daily.   ticagrelor 90 MG  Tabs tablet Commonly known as: BRILINTA Take 1 tablet (90 mg total) by mouth 2 (two) times daily.   zolpidem 10 MG tablet Commonly known as: AMBIEN Take 5-10 mg by mouth at bedtime as needed for sleep.        Follow-up Information     Lonie Peak, PA-C. Schedule an appointment as soon as possible for a visit in 1 week(s).   Specialty: Physician Assistant Contact information: [address] [place] [address] [phone]         Marykay Lex, MD .   Specialty: Cardiology Contact information: [address] [place] [address] [phone]                Allergies  Allergen Reactions   Pantoprazole     arthralgia    Consultations: Cardiology   Procedures/Studies: DG Chest 2 View  Result Date: 09/15/2020 CLINICAL DATA:  Chest pain. EXAM: CHEST - 2 VIEW COMPARISON:  Chest radiograph dated 06/12/2017. FINDINGS: Left lung base streaky atelectasis or scarring. No focal consolidation, pleural effusion, or pneumothorax. Stable cardiomediastinal silhouette. Median sternotomy wires and CABG vascular clips. No acute osseous pathology. IMPRESSION: No active cardiopulmonary disease. Electronically Signed   By: Elgie Collard M.D.   On: 09/15/2020 02:49   CARDIAC CATHETERIZATION  Result Date: 09/15/2020   RPAV lesion is 90% stenosed.   Ost LAD lesion is 80% stenosed.   Mid LAD lesion is 100% stenosed.LIMA to LAD is patent.   Dist LAD lesion is 60% stenosed, diffusely diseased.   Prox Cx to Mid Cx lesion is 50% stenosed.   Ost 1st Mrg to 1st Mrg lesion is 100% stenosed. SVG to OM is patent.   Ramus-1 lesion is 70% stenosed.  Appearance has improved since the prior cath.   Prox RCA to Dist RCA lesion is 100% stenosed.   SVG to RPDA with  Mid Graft lesion 75% stenosed.   A drug-eluting stent was successfully placed using a STENT ONYX FRONTIER [id number].   Post intervention, there is a 0% residual stenosis.   The left ventricular systolic function is  normal.   LV end diastolic pressure is normal.   The left ventricular ejection fraction is 55-65% by visual estimate.   There is no aortic valve stenosis. Successful PCI of SVG to PDA.  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  DAPT for 12 months. Consider clopidogrel monotherapy after that time given his diffuse disease. Results discussed with Renae [phone].   ECHOCARDIOGRAM COMPLETE  Result Date: 09/15/2020    ECHOCARDIOGRAM REPORT   Patient Name:   BLAIZE EPPLE Date of Exam: 09/15/2020 Medical Rec #:  [id number]        Height:       69.0 in Accession #:    [id number]       Weight:       214.2 lb Date of Birth:  03-Mar-1955       BSA:  2.127 m Patient Age:    64 years         BP:           113/69 mmHg Patient Gender: M                HR:           57 bpm. Exam Location:  Inpatient Procedure: 2D Echo Indications:    chest pain  History:        Patient has no prior history of Echocardiogram examinations.                 CAD; Risk Factors:Dyslipidemia.  Sonographer:    Delcie Roch RDCS Referring Phys: [phone] Peni Rupard  Sonographer Comments: Suboptimal subcostal window. IMPRESSIONS  1. Left ventricular ejection fraction, by estimation, is 55 to 60%. The left ventricle has normal function. The left ventricle has no regional wall motion abnormalities. Left ventricular diastolic parameters are indeterminate.  2. Right ventricular systolic function is normal. The right ventricular size is normal. Tricuspid regurgitation signal is inadequate for assessing PA pressure.  3. Left atrial size was mildly dilated.  4. Right atrial size was mildly dilated.  5. The mitral valve is normal in structure. Trivial mitral valve regurgitation. No evidence of mitral stenosis.  6. The aortic valve is tricuspid. There is mild calcification of the aortic valve. Aortic valve regurgitation is mild. No aortic stenosis is present.  7. Aortic dilatation noted. There is mild dilatation of the ascending aorta,  measuring 42 mm. Comparison(s): No prior Echocardiogram. Conclusion(s)/Recommendation(s): Normal LVEF and wall motion. Mildly dilated ascending aorta. FINDINGS  Left Ventricle: Left ventricular ejection fraction, by estimation, is 55 to 60%. The left ventricle has normal function. The left ventricle has no regional wall motion abnormalities. The left ventricular internal cavity size was normal in size. There is  no left ventricular hypertrophy. Left ventricular diastolic parameters are indeterminate. Right Ventricle: The right ventricular size is normal. No increase in right ventricular wall thickness. Right ventricular systolic function is normal. Tricuspid regurgitation signal is inadequate for assessing PA pressure. Left Atrium: Left atrial size was mildly dilated. Right Atrium: Right atrial size was mildly dilated. Pericardium: There is no evidence of pericardial effusion. Mitral Valve: Focal calcification on mid portion of anterior mitral valve leaflet. The mitral valve is normal in structure. There is mild calcification of the mitral valve leaflet(s). Trivial mitral valve regurgitation. No evidence of mitral valve stenosis. Tricuspid Valve: The tricuspid valve is normal in structure. Tricuspid valve regurgitation is trivial. No evidence of tricuspid stenosis. Aortic Valve: The aortic valve is tricuspid. There is mild calcification of the aortic valve. Aortic valve regurgitation is mild. No aortic stenosis is present. Pulmonic Valve: The pulmonic valve was not well visualized. Pulmonic valve regurgitation is not visualized. No evidence of pulmonic stenosis. Aorta: Aortic dilatation noted. There is mild dilatation of the ascending aorta, measuring 42 mm. Venous: The inferior vena cava was not well visualized. IAS/Shunts: The atrial septum is grossly normal.  LEFT VENTRICLE PLAX 2D LVIDd:         5.00 cm Diastology LVIDs:         3.70 cm LV e' medial:    7.29 cm/s LV PW:         1.00 cm LV E/e' medial:  9.0 LV  IVS:        0.90 cm LV e' lateral:   10.10 cm/s  LV E/e' lateral: 6.5  RIGHT VENTRICLE RV S prime:     10.00 cm/s TAPSE (M-mode): 1.5 cm LEFT ATRIUM             Index       RIGHT ATRIUM           Index LA diam:        4.20 cm 1.97 cm/m  RA Area:     16.20 cm LA Vol (A2C):   64.9 ml 30.51 ml/m RA Volume:   44.30 ml  20.83 ml/m LA Vol (A4C):   61.5 ml 28.91 ml/m LA Biplane Vol: 67.1 ml 31.55 ml/m  AORTIC VALVE LVOT Vmax:   101.00 cm/s LVOT Vmean:  63.800 cm/s LVOT VTI:    0.251 m  AORTA Ao Asc diam: 3.95 cm MITRAL VALVE MV Area (PHT): 3.00 cm    SHUNTS MV Decel Time: 253 msec    Systemic VTI: 0.25 m MV E velocity: 65.60 cm/s MV A velocity: 46.30 cm/s MV E/A ratio:  1.42 Jodelle RedBridgette Christopher MD Electronically signed by Jodelle RedBridgette Christopher MD Signature Date/Time: 09/15/2020/3:02:06 PM    Final       Subjective: Patient seen and examined the bedside this morning.  Hemodynamically stable for discharge today.  Discharge Exam: Vitals:   09/16/20 0520 09/16/20 1048  BP: 125/78 (!) 160/93  Pulse: (!) 53 67  Resp: 19   Temp: 98.4 F (36.9 C)   SpO2: 97%    Vitals:   09/15/20 2007 09/15/20 2156 09/16/20 0520 09/16/20 1048  BP: (!) 143/84 133/83 125/78 (!) 160/93  Pulse: 67 65 (!) 53 67  Resp: 18  19   Temp: 98.6 F (37 C)  98.4 F (36.9 C)   TempSrc: Oral  Oral   SpO2: 99%  97%   Weight:      Height:        General: Pt is alert, awake, not in acute distress Cardiovascular: RRR, S1/S2 +, no rubs, no gallops Respiratory: CTA bilaterally, no wheezing, no rhonchi Abdominal: Soft, NT, ND, bowel sounds + Extremities: no edema, no cyanosis    The results of significant diagnostics from this hospitalization (including imaging, microbiology, ancillary and laboratory) are listed below for reference.     Microbiology: Recent Results (from the past 240 hour(s))  SARS CORONAVIRUS 2 (TAT 6-24 HRS) Nasopharyngeal Nasopharyngeal Swab     Status: None   Collection Time:  09/15/20  4:55 AM   Specimen: Nasopharyngeal Swab  Result Value Ref Range Status   SARS Coronavirus 2 NEGATIVE NEGATIVE Final    Comment: (NOTE) SARS-CoV-2 target nucleic acids are NOT DETECTED.  The SARS-CoV-2 RNA is generally detectable in upper and lower respiratory specimens during the acute phase of infection. Negative results do not preclude SARS-CoV-2 infection, do not rule out co-infections with other pathogens, and should not be used as the sole basis for treatment or other patient management decisions. Negative results must be combined with clinical observations, patient history, and epidemiological information. The expected result is Negative.  Fact Sheet for Patients: HairSlick.nohttps://www.fda.gov/media/138098/download  Fact Sheet for Healthcare Providers: quierodirigir.comhttps://www.fda.gov/media/138095/download  This test is not yet approved or cleared by the [place] FDA and  has been authorized for detection and/or diagnosis of SARS-CoV-2 by FDA under an Emergency Use Authorization (EUA). This EUA will remain  in effect (meaning this test can be used) for the duration of the COVID-19 declaration under Se ction 564(b)(1) of the Act, 21 U.S.C. section 360bbb-3(b)(1), unless the authorization is terminated or revoked  sooner.  Performed at [facility], [address] [place], [place] [address]      Labs: BNP (last 3 results) No results for input(s): BNP in the last 8760 hours. Basic Metabolic Panel: Recent Labs  Lab 09/15/20 0226 09/15/20 0556 09/16/20 0243  NA 137  --  139  K 3.7  --  3.9  CL 107  --  103  CO2 24  --  26  GLUCOSE 112*  --  103*  BUN 18  --  15  CREATININE 1.04 1.02 1.04  CALCIUM 8.6*  --  8.8*   Liver Function Tests: Recent Labs  Lab 09/15/20 0226  AST 57*  ALT 54*  ALKPHOS 49  BILITOT 0.6  PROT 6.0*  ALBUMIN 3.6   No results for input(s): LIPASE, AMYLASE in the last 168 hours. No results for input(s): AMMONIA in the last 168  hours. CBC: Recent Labs  Lab 09/15/20 0226 09/15/20 0556 09/16/20 0243  WBC 4.5 7.2 5.6  HGB 13.0 13.6 13.2  HCT 39.1 39.7 39.1  MCV 96.1 94.5 94.0  PLT 127* 135* 123*   Cardiac Enzymes: No results for input(s): CKTOTAL, CKMB, CKMBINDEX, TROPONINI in the last 168 hours. BNP: Invalid input(s): POCBNP CBG: No results for input(s): GLUCAP in the last 168 hours. D-Dimer No results for input(s): DDIMER in the last 72 hours. Hgb A1c No results for input(s): HGBA1C in the last 72 hours. Lipid Profile No results for input(s): CHOL, HDL, LDLCALC, TRIG, CHOLHDL, LDLDIRECT in the last 72 hours. Thyroid function studies No results for input(s): TSH, T4TOTAL, T3FREE, THYROIDAB in the last 72 hours.  Invalid input(s): FREET3 Anemia work up No results for input(s): VITAMINB12, FOLATE, FERRITIN, TIBC, IRON, RETICCTPCT in the last 72 hours. Urinalysis No results found for: COLORURINE, APPEARANCEUR, LABSPEC, PHURINE, GLUCOSEU, HGBUR, BILIRUBINUR, KETONESUR, PROTEINUR, UROBILINOGEN, NITRITE, LEUKOCYTESUR Sepsis Labs Invalid input(s): PROCALCITONIN,  WBC,  LACTICIDVEN Microbiology Recent Results (from the past 240 hour(s))  SARS CORONAVIRUS 2 (TAT 6-24 HRS) Nasopharyngeal Nasopharyngeal Swab     Status: None   Collection Time: 09/15/20  4:55 AM   Specimen: Nasopharyngeal Swab  Result Value Ref Range Status   SARS Coronavirus 2 NEGATIVE NEGATIVE Final    Comment: (NOTE) SARS-CoV-2 target nucleic acids are NOT DETECTED.  The SARS-CoV-2 RNA is generally detectable in upper and lower respiratory specimens during the acute phase of infection. Negative results do not preclude SARS-CoV-2 infection, do not rule out co-infections with other pathogens, and should not be used as the sole basis for treatment or other patient management decisions. Negative results must be combined with clinical observations, patient history, and epidemiological information. The expected result is Negative.  Fact  Sheet for Patients: HairSlick.no  Fact Sheet for Healthcare Providers: quierodirigir.com  This test is not yet approved or cleared by the [place] FDA and  has been authorized for detection and/or diagnosis of SARS-CoV-2 by FDA under an Emergency Use Authorization (EUA). This EUA will remain  in effect (meaning this test can be used) for the duration of the COVID-19 declaration under Se ction 564(b)(1) of the Act, 21 U.S.C. section 360bbb-3(b)(1), unless the authorization is terminated or revoked sooner.  Performed at [facility], [address] [place], [place] [address]     Please note: You were cared for by a hospitalist during your hospital stay. Once you are discharged, your primary care physician will handle any further medical issues. Please note that NO REFILLS for any discharge medications will be  authorized once you are discharged, as it is imperative that you return to your primary care physician (or establish a relationship with a primary care physician if you do not have one) for your post hospital discharge needs so that they can reassess your need for medications and monitor your lab values.    Time coordinating discharge: 40 minutes  SIGNED:   Burnadette Pop, MD  [facility] 09/16/2020, 1:53 PM Pager [phone]  If 7PM-7AM, please contact night-coverage [email or web address] Password [email or web address]

## 2020-09-16 NOTE — Progress Notes (Signed)
CARDIAC REHAB PHASE I   PRE:  Rate/Rhythm: 56 SB  BP:  Supine:   Sitting: 140/95  Standing:    SaO2: 99%RA  MODE:  Ambulation: 750 ft   POST:  Rate/Rhythm: 86 SR  BP:  Supine:   Sitting: 144/90  Standing:    SaO2: 100%RA 0807-0905 Pt walked 750 ft on RA with steady gait and tolerated well. No CP. Education completed with pt who voiced understanding. Stressed importance of brilinta with stent. Reviewed NTG use, walking for ex, heart healthy food choices, tobacco cessation (chews), and CRP 2. Referral to [facility] CRP 2.   Luetta Nutting, RN BSN  09/16/2020 9:00 AM

## 2020-09-16 NOTE — Progress Notes (Signed)
Discharge instructions (including medications) discussed with and copy provided to patient/caregiver 

## 2020-09-16 NOTE — Progress Notes (Addendum)
Progress Note  Patient Name: Edgar Brooks Date of Encounter: 09/16/2020  [facility] Cardiologist: Bryan Lemma, MD   Subjective   Feeling well. Walked with CR without recurrent chest pain.   Inpatient Medications    Scheduled Meds:  aspirin  81 mg Oral Daily   atorvastatin  40 mg Oral Daily   enoxaparin (LOVENOX) injection  40 mg Subcutaneous Daily   ezetimibe  10 mg Oral Daily   isosorbide mononitrate  30 mg Oral Daily   metoprolol tartrate  37.5 mg Oral BID   pantoprazole  40 mg Oral Daily   sodium chloride flush  3 mL Intravenous Q12H   ticagrelor  90 mg Oral BID   Continuous Infusions:  sodium chloride     PRN Meds: sodium chloride, acetaminophen, morphine injection, nitroGLYCERIN, ondansetron (ZOFRAN) IV, sodium chloride flush, zolpidem   Vital Signs    Vitals:   09/15/20 1627 09/15/20 2007 09/15/20 2156 09/16/20 0520  BP: (!) 150/97 (!) 143/84 133/83 125/78  Pulse: 66 67 65 (!) 53  Resp: 20 18  19   Temp: 99 F (37.2 C) 98.6 F (37 C)  98.4 F (36.9 C)  TempSrc: Oral Oral  Oral  SpO2: 99% 99%  97%  Weight: 94.8 kg     Height: 5\' 7"  (1.702 m)       Intake/Output Summary (Last 24 hours) at 09/16/2020 0959 Last data filed at 09/15/2020 2230 Gross per 24 hour  Intake 324.34 ml  Output --  Net 324.34 ml   Last 3 Weights 09/15/2020 09/15/2020 04/21/2020  Weight (lbs) 208 lb 15.9 oz 217 lb 214 lb 3.2 oz  Weight (kg) 94.8 kg 98.431 kg 97.16 kg      Telemetry    SR-->SB - Personally Reviewed  ECG    SB, 51 bpm - Personally Reviewed  Physical Exam   GEN: No acute distress.   Neck: No JVD Cardiac: RRR, no murmurs, rubs, or gallops.  Respiratory: Clear to auscultation bilaterally. GI: Soft, nontender, non-distended  MS: No edema; No deformity. Left radial cath site stable. Neuro:  Nonfocal  Psych: Normal affect   Labs    High Sensitivity Troponin:   Recent Labs  Lab 09/15/20 0226 09/15/20 0457  TROPONINIHS 4 4      Chemistry Recent  Labs  Lab 09/15/20 0226 09/15/20 0556 09/16/20 0243  NA 137  --  139  K 3.7  --  3.9  CL 107  --  103  CO2 24  --  26  GLUCOSE 112*  --  103*  BUN 18  --  15  CREATININE 1.04 1.02 1.04  CALCIUM 8.6*  --  8.8*  PROT 6.0*  --   --   ALBUMIN 3.6  --   --   AST 57*  --   --   ALT 54*  --   --   ALKPHOS 49  --   --   BILITOT 0.6  --   --   GFRNONAA >60 >60 >60  ANIONGAP 6  --  10     Hematology Recent Labs  Lab 09/15/20 0226 09/15/20 0556 09/16/20 0243  WBC 4.5 7.2 5.6  RBC 4.07* 4.20* 4.16*  HGB 13.0 13.6 13.2  HCT 39.1 39.7 39.1  MCV 96.1 94.5 94.0  MCH 31.9 32.4 31.7  MCHC 33.2 34.3 33.8  RDW 12.5 12.6 12.6  PLT 127* 135* 123*    BNPNo results for input(s): BNP, PROBNP in the last 168 hours.   DDimer  No results for input(s): DDIMER in the last 168 hours.   Radiology    DG Chest 2 View  Result Date: 09/15/2020 CLINICAL DATA:  Chest pain. EXAM: CHEST - 2 VIEW COMPARISON:  Chest radiograph dated 06/12/2017. FINDINGS: Left lung base streaky atelectasis or scarring. No focal consolidation, pleural effusion, or pneumothorax. Stable cardiomediastinal silhouette. Median sternotomy wires and CABG vascular clips. No acute osseous pathology. IMPRESSION: No active cardiopulmonary disease. Electronically Signed   By: Elgie CollardArash  Radparvar M.D.   On: 09/15/2020 02:49   CARDIAC CATHETERIZATION  Result Date: 09/15/2020   RPAV lesion is 90% stenosed.   Ost LAD lesion is 80% stenosed.   Mid LAD lesion is 100% stenosed.LIMA to LAD is patent.   Dist LAD lesion is 60% stenosed, diffusely diseased.   Prox Cx to Mid Cx lesion is 50% stenosed.   Ost 1st Mrg to 1st Mrg lesion is 100% stenosed. SVG to OM is patent.   Ramus-1 lesion is 70% stenosed.  Appearance has improved since the prior cath.   Prox RCA to Dist RCA lesion is 100% stenosed.   SVG to RPDA with  Mid Graft lesion 75% stenosed.   A drug-eluting stent was successfully placed using a STENT ONYX FRONTIER 3.5X18.   Post intervention,  there is a 0% residual stenosis.   The left ventricular systolic function is normal.   LV end diastolic pressure is normal.   The left ventricular ejection fraction is 55-65% by visual estimate.   There is no aortic valve stenosis. Successful PCI of SVG to PDA.  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  DAPT for 12 months. Consider clopidogrel monotherapy after that time given his diffuse disease. Results discussed with Renae [phone].   ECHOCARDIOGRAM COMPLETE  Result Date: 09/15/2020    ECHOCARDIOGRAM REPORT   Patient Name:   Edgar BastROBERT D Bufkin Date of Exam: 09/15/2020 Medical Rec #:  [id number]        Height:       69.0 in Accession #:    [id number]       Weight:       214.2 lb Date of Birth:  Sep 26, 1955       BSA:          2.127 m Patient Age:    65 years         BP:           113/69 mmHg Patient Gender: M                HR:           57 bpm. Exam Location:  Inpatient Procedure: 2D Echo Indications:    chest pain  History:        Patient has no prior history of Echocardiogram examinations.                 CAD; Risk Factors:Dyslipidemia.  Sonographer:    Delcie RochLauren Pennington RDCS Referring Phys: [phone] AMRIT ADHIKARI  Sonographer Comments: Suboptimal subcostal window. IMPRESSIONS  1. Left ventricular ejection fraction, by estimation, is 55 to 60%. The left ventricle has normal function. The left ventricle has no regional wall motion abnormalities. Left ventricular diastolic parameters are indeterminate.  2. Right ventricular systolic function is normal. The right ventricular size is normal. Tricuspid regurgitation signal is inadequate for assessing PA pressure.  3. Left atrial size was mildly dilated.  4. Right atrial size was mildly dilated.  5. The mitral valve is normal in structure. Trivial mitral valve  regurgitation. No evidence of mitral stenosis.  6. The aortic valve is tricuspid. There is mild calcification of the aortic valve. Aortic valve regurgitation is mild. No aortic stenosis is present.  7.  Aortic dilatation noted. There is mild dilatation of the ascending aorta, measuring 42 mm. Comparison(s): No prior Echocardiogram. Conclusion(s)/Recommendation(s): Normal LVEF and wall motion. Mildly dilated ascending aorta. FINDINGS  Left Ventricle: Left ventricular ejection fraction, by estimation, is 55 to 60%. The left ventricle has normal function. The left ventricle has no regional wall motion abnormalities. The left ventricular internal cavity size was normal in size. There is  no left ventricular hypertrophy. Left ventricular diastolic parameters are indeterminate. Right Ventricle: The right ventricular size is normal. No increase in right ventricular wall thickness. Right ventricular systolic function is normal. Tricuspid regurgitation signal is inadequate for assessing PA pressure. Left Atrium: Left atrial size was mildly dilated. Right Atrium: Right atrial size was mildly dilated. Pericardium: There is no evidence of pericardial effusion. Mitral Valve: Focal calcification on mid portion of anterior mitral valve leaflet. The mitral valve is normal in structure. There is mild calcification of the mitral valve leaflet(s). Trivial mitral valve regurgitation. No evidence of mitral valve stenosis. Tricuspid Valve: The tricuspid valve is normal in structure. Tricuspid valve regurgitation is trivial. No evidence of tricuspid stenosis. Aortic Valve: The aortic valve is tricuspid. There is mild calcification of the aortic valve. Aortic valve regurgitation is mild. No aortic stenosis is present. Pulmonic Valve: The pulmonic valve was not well visualized. Pulmonic valve regurgitation is not visualized. No evidence of pulmonic stenosis. Aorta: Aortic dilatation noted. There is mild dilatation of the ascending aorta, measuring 42 mm. Venous: The inferior vena cava was not well visualized. IAS/Shunts: The atrial septum is grossly normal.  LEFT VENTRICLE PLAX 2D LVIDd:         5.00 cm Diastology LVIDs:         3.70 cm LV  e' medial:    7.29 cm/s LV PW:         1.00 cm LV E/e' medial:  9.0 LV IVS:        0.90 cm LV e' lateral:   10.10 cm/s                        LV E/e' lateral: 6.5  RIGHT VENTRICLE RV S prime:     10.00 cm/s TAPSE (M-mode): 1.5 cm LEFT ATRIUM             Index       RIGHT ATRIUM           Index LA diam:        4.20 cm 1.97 cm/m  RA Area:     16.20 cm LA Vol (A2C):   64.9 ml 30.51 ml/m RA Volume:   44.30 ml  20.83 ml/m LA Vol (A4C):   61.5 ml 28.91 ml/m LA Biplane Vol: 67.1 ml 31.55 ml/m  AORTIC VALVE LVOT Vmax:   101.00 cm/s LVOT Vmean:  63.800 cm/s LVOT VTI:    0.251 m  AORTA Ao Asc diam: 3.95 cm MITRAL VALVE MV Area (PHT): 3.00 cm    SHUNTS MV Decel Time: 253 msec    Systemic VTI: 0.25 m MV E velocity: 65.60 cm/s MV A velocity: 46.30 cm/s MV E/A ratio:  1.42 Jodelle Red MD Electronically signed by Jodelle Red MD Signature Date/Time: 09/15/2020/3:02:06 PM    Final     Cardiac Studies   Cath: 09/15/20  RPAV lesion is 90% stenosed.   Ost LAD lesion is 80% stenosed.   Mid LAD lesion is 100% stenosed.LIMA to LAD is patent.   Dist LAD lesion is 60% stenosed, diffusely diseased.   Prox Cx to Mid Cx lesion is 50% stenosed.   Ost 1st Mrg to 1st Mrg lesion is 100% stenosed. SVG to OM is patent.   Ramus-1 lesion is 70% stenosed.  Appearance has improved since the prior cath.   Prox RCA to Dist RCA lesion is 100% stenosed.   SVG to RPDA with  Mid Graft lesion 75% stenosed.   A drug-eluting stent was successfully placed using a STENT ONYX FRONTIER 3.5X18.   Post intervention, there is a 0% residual stenosis.   The left ventricular systolic function is normal.   LV end diastolic pressure is normal.   The left ventricular ejection fraction is 55-65% by visual estimate.   There is no aortic valve stenosis.   Successful PCI of SVG to PDA.   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     DAPT for 12 months. Consider clopidogrel monotherapy after that time given his diffuse disease.     Results discussed with Renae [phone].   Diagnostic Dominance: Right Intervention    Echo: 09/15/20  IMPRESSIONS     1. Left ventricular ejection fraction, by estimation, is 55 to 60%. The  left ventricle has normal function. The left ventricle has no regional  wall motion abnormalities. Left ventricular diastolic parameters are  indeterminate.   2. Right ventricular systolic function is normal. The right ventricular  size is normal. Tricuspid regurgitation signal is inadequate for assessing  PA pressure.   3. Left atrial size was mildly dilated.   4. Right atrial size was mildly dilated.   5. The mitral valve is normal in structure. Trivial mitral valve  regurgitation. No evidence of mitral stenosis.   6. The aortic valve is tricuspid. There is mild calcification of the  aortic valve. Aortic valve regurgitation is mild. No aortic stenosis is  present.   7. Aortic dilatation noted. There is mild dilatation of the ascending  aorta, measuring 42 mm.   Comparison(s): No prior Echocardiogram.   Conclusion(s)/Recommendation(s): Normal LVEF and wall motion. Mildly  dilated ascending aorta.   Patient Profile     65 y.o. male with a hx of CAD s/p CABG 2014 with LIMA-LAD, SVG-OM1, SCG>PDA (patent on last cath 2016), ascending aortic aneurysm at 4.2cm, HLD, HTN, and post-operative atrial fibrillation not on [facility] who was seen the evaluation of chest pain at the request of Dr. Toniann Fail.  Assessment & Plan    Unstable angina with known CAD s/p CABG '14: hsTn 4>>4. Underwent cardiac cath yesterday noted above with successful PCI of SVG to PDA for 75% lesion, patent LIMA to LAD, SVG-OM. Plan for DAPT with ASA/Brilinta for one year, then consider plavix as monotherapy given diffuse disease. No recurrent chest pain post cath. Seen by CR.  -- continue ASA, Brilinta, statin, zetia, BB, Imdur  Elevated LFTs: mildly elevated on the last 2 readings, but stable.  -- will continue on  atorvastatin  -- follow up LFTs as an outpatient  HTN: blood pressures are stable -- continue metoprolol 37.5mg  BID  HLD: LDL 50 (7/22) -- continue statin with close outpatient monitoring of LFTs as noted above  Hx of PAF: post op AF. Wore a monitor in 2019 which noted short episodes of AF, <1%. No recurrent palpitations.  -- no plans for [facility]  Aortic aneurysm: mildly dilated, 42mm on echo  Will arrange for outpatient follow up in the office.    For questions or updates, please contact [facility] Please consult [email or web address] for contact info under        Signed, Laverda Page, NP  09/16/2020, 9:59 AM     Patient seen and evaluated today along with Laverda Page, NP.  He is doing well post PCI.  No major issues.  Had some questions about Brilinta cost that will need to potentially be reassessed in the outpatient setting. He is doing well.  No further chest pain.  Stable for discharge from cardiac standpoint.   Bryan Lemma, MD

## 2020-09-16 NOTE — Plan of Care (Signed)
  Problem: Education: Goal: Knowledge of General Education information will improve Description: Including pain rating scale, medication(s)/side effects and non-pharmacologic comfort measures Outcome: Adequate for Discharge   

## 2020-09-16 NOTE — TOC Benefit Eligibility Note (Signed)
Patient Product/process development scientist completed.    The patient is currently admitted and upon discharge could be taking Brilinta 90 mg.  The current 30 day co-pay is, $25.00, [employer], the mfr of BRILINTA 90 MG TABLET paid 22.00 toward plan copay.   The patient is insured through [employer]     Roland Earl, CPhT Pharmacy Patient Advocate Specialist [facility] Antimicrobial Stewardship Team Direct Number: [phone]  Fax: [phone]

## 2020-09-21 ENCOUNTER — Telehealth (HOSPITAL_COMMUNITY): Payer: Self-pay

## 2020-09-21 NOTE — Telephone Encounter (Signed)
Per phase I cardiac rehab, fax cardiac rehab referral to [facility] cardiac rehab.

## 2020-09-24 NOTE — Progress Notes (Signed)
Cardiology Clinic Note   Patient Name: Edgar BastRobert D Hone Date of Encounter: 09/27/2020  Primary Care Provider:  Lonie Peakonroy, Nathan, PA-C Primary Cardiologist:  Bryan Lemmaavid Harding, MD  Patient Profile    Edgar BastRobert D Brooks presents to the clinic today status post cardiac catheterization 09/15/2020.  Past Medical History    Past Medical History:  Diagnosis Date   Ascending aortic aneurysm (HCC) 07/2012   4.2 cm    Coronary artery disease involving native coronary artery of native heart with angina pectoris (HCC) 07/19/2012;; 10/2014   ([facility]) Abnormal Nuclear ST --> Prox LAD ~75% calcified, RI - large & bifurcating w/ 90% lesion in medial (Diag) territory, Cx- small OM & PLB, RCA 80% mid --> [facility];; b. CATH ([facility]) Patent Grafts, mLAD (after sm D1) 100%, Inf Branch of RI 100%, Superior RI branch (non-grafted) tandem ~80 & 65%, mRCA 100%, Cx mod Dz, dRCA - RPAV severe 90+%.   Hyperlipidemia LDL goal <70    Postoperative atrial fibrillation (HCC) 08/2012   Post-op CABG - Rx wiht Amiodarone; no reported recurrence.   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 Remo LippsSheridan) LIMA-LAD, SVG-OM1 (Inf [place]), SVG-PDA   Past Surgical History:  Procedure Laterality Date   CARDIAC CATHETERIZATION [name] 11/03/2014   Procedure: Left Heart Cath and Cors/Grafts Angiography;  Surgeon: Marykay Lexavid W Harding, MD;  [facility];  Service: Cardiovascular;  mLAD (after sm D1) 100%, OM1/infRI - 100%, SupRI (non-grafted) 80 & 65%, Cx-OM diffuse mode Dz, mRCA 100% & RPAV 95%; Patent LIMA-LAD, SVG-RPDA, SVG-RI(OM)   CARDIAC CATHETERIZATION  07/2012   [facility]: 75% mLAD (after sm D`), RI - Inf branch 80%, Cx-OM moderate caliber,mRCA 80% & RPAV 95% --> CABG   CORONARY ARTERY BYPASS GRAFT  2014   3V - LIMA-LAD, SVG-Inferior RI branch (called OM1), SVG-rPDA   CORONARY STENT INTERVENTION [name] 09/15/2020   Procedure: CORONARY STENT INTERVENTION;  Surgeon: Corky CraftsVaranasi, Jayadeep S, MD;  Location: [facility] INVASIVE CV LAB;   Service: Cardiovascular;  Laterality: [name];   LEFT HEART CATH AND CORS/GRAFTS ANGIOGRAPHY [name] 09/15/2020   Procedure: LEFT HEART CATH AND CORS/GRAFTS ANGIOGRAPHY;  Surgeon: Corky CraftsVaranasi, Jayadeep S, MD;  [facility];  Service: Cardiovascular;  Laterality: [name];   NM MYOVIEW LTD  07/2012   ABNORMAL: Mod Size, Mildly Severe Basal Mid & Apical Inferior reversible defect & ++ GXT portion with limiting Angina --> CATH   TRANSTHORACIC ECHOCARDIOGRAM  07/2012   [facility]: EF > 55%, Dgen MV Dz w/ mild MR, Ao Sclerosis, Mild AI, Ascending Aorta ~4.0-4.2 cm @ sinotubular Jxn    Allergies  Allergies  Allergen Reactions   Pantoprazole     arthralgia    History of Present Illness    Edgar Brooks has a PMH of coronary artery disease status [facility]  His PMH also includes a sending aortic aneurysm, and paroxysmal atrial fibrillation.  He presented to the emergency department on 09/15/2020 with complaints of chest discomfort.  He reported that his symptoms came on as he was watching television.  He described the discomfort as a burning type sensation.  He took a sublingual nitroglycerin and his pain resolved.  His pain returned later that night.  He describes more intense pain and reported that he came diaphoretic.  He took another sublingual nitroglycerin but did not feel relief.  He contacted EMS who advised him to take aspirin.  He took aspirin and his  chest pain resolved.  He denied any associated nausea, vomiting, abdominal pain, and shortness of breath.  On presentation to the emergency department he was chest pain-free.  His high-sensitivity troponins were negative.  His CXR was unremarkable.  His LFT labs were slightly elevated.  He was admitted with unstable angina.  His troponins continue to be normal.  Due to his unstable angina a echocardiogram was obtained which showed an LVEF of 55-60% and no wall motion abnormalities.  He then underwent cardiac catheterization which  showed multivessel disease.  He received PCI with DES to his SVG-PDA graft which was 75% stenosed.  He was discharged home in stable condition on 09/16/2020.  He presents to the clinic today for follow-up evaluation states he feels well today.  He did have 1 episode of chest discomfort the day after he was discharged from the hospital.  He reports that he took some Pepto-Bismol and his discomfort went away.  He has been taking his medications as prescribed and denies side effects.  We reviewed the importance of heart healthy low-sodium diet and maintain his physical activity.  We reviewed his angiography and cardiac catheterization as well as his lipid profile.  I will give him a salty 6 diet sheet, have him maintain his physical activity, and follow-up in 3 to 4 months.  Today he denies chest pain, shortness of breath, lower extremity edema, fatigue, palpitations, melena, hematuria, hemoptysis, diaphoresis, weakness, presyncope, syncope, orthopnea, and PND.   Home Medications    Prior to Admission medications   Medication Sig Start Date End Date Taking? Authorizing Provider  aspirin EC 81 MG tablet Take 81 mg daily by mouth.    [provider]  atorvastatin (LIPITOR) 40 MG tablet Take 40 mg by mouth daily.    [provider]  esomeprazole (NEXIUM) 40 MG capsule Take 40 mg by mouth daily. 12/27/18   [provider]  ezetimibe (ZETIA) 10 MG tablet TAKE 1 TABLET BY MOUTH EVERY DAY 07/09/20   Marykay Lex, MD  isosorbide mononitrate (IMDUR) 30 MG 24 hr tablet Take 1 tablet (30 mg total) by mouth daily. 11/04/14   Hongalgi, Maximino Greenland, MD  metoprolol tartrate (LOPRESSOR) 25 MG tablet TAKE 1 & 1/2 TABLETS BY MOUTH 2 TIMES A DAY. MAY TAKE AN ADDITIONAL 1/2 TABLET IF NEEDED Patient taking differently: Take 37.5 mg by mouth See admin instructions. 37.5 mg twice daily. May take an additional 12.5 mg if needed for HR 04/04/18   Marykay Lex, MD  Multiple Vitamins-Minerals  (MULTI-DAY PLUS MINERALS) TABS Take 1 tablet by mouth daily.    [provider]  nitroGLYCERIN (NITROSTAT) 0.4 MG SL tablet Place 0.4 mg under the tongue every 5 (five) minutes as needed for chest pain. 10/26/14   [provider]  pyridOXINE (VITAMIN B-6) 50 MG tablet Take 50 mg by mouth daily.    [provider]  ticagrelor (BRILINTA) 90 MG TABS tablet Take 1 tablet (90 mg total) by mouth 2 (two) times daily. 09/16/20   Burnadette Pop, MD  zolpidem (AMBIEN) 10 MG tablet Take 5-10 mg by mouth at bedtime as needed for sleep. 04/22/15   [provider]    Family History    Family History  Problem Relation Age of Onset   Stroke Father    Other Mother        stomach problems    He indicated that his mother is deceased. He indicated that his father is deceased. He indicated that both of  his sisters are alive. He indicated that his maternal grandmother is alive. He indicated that his maternal grandfather is alive. He indicated that his paternal grandmother is alive. He indicated that his paternal grandfather is alive. He indicated that his daughter is alive. He indicated that his son is alive.  Social History    Social History   Socioeconomic History   Marital status: Married    Spouse name: Not on file   Number of children: Not on file   Years of education: Not on file   Highest education level: Not on file  Occupational History   Not on file  Tobacco Use   Smoking status: Never   Smokeless tobacco: Current    Types: Chew  Substance and Sexual Activity   Alcohol use: Yes    Alcohol/week: 2.0 standard drinks    Types: 2 Cans of beer per week    Comment: history of heavy ETOH abuse, 1/2 gallon of whiskey every 2 days   Drug use: No   Sexual activity: Not on file  Other Topics Concern   Not on file  Social History Narrative   Not on file   Social Determinants of Health   Financial Resource Strain: Not on file  Food Insecurity: Not on file   Transportation Needs: Not on file  Physical Activity: Not on file  Stress: Not on file  Social Connections: Not on file  Intimate Partner Violence: Not on file     Review of Systems    General:  No chills, fever, night sweats or weight changes.  Cardiovascular:  No chest pain, dyspnea on exertion, edema, orthopnea, palpitations, paroxysmal nocturnal dyspnea. Dermatological: No rash, lesions/masses Respiratory: No cough, dyspnea Urologic: No hematuria, dysuria Abdominal:   No nausea, vomiting, diarrhea, bright red blood per rectum, melena, or hematemesis Neurologic:  No visual changes, wkns, changes in mental status. All other systems reviewed and are otherwise negative except as noted above.  Physical Exam    VS:  BP 136/68 (BP Location: Left Arm, Patient Position: Sitting, Cuff Size: Large)   Pulse 76   Ht 5\' 7"  (1.702 m)   Wt 211 lb 9.6 oz (96 kg)   SpO2 98%   BMI 33.14 kg/m  , BMI Body mass index is 33.14 kg/m. GEN: Well nourished, well developed, in no acute distress. HEENT: normal. Neck: Supple, no JVD, carotid bruits, or masses. Cardiac: RRR, no murmurs, rubs, or gallops. No clubbing, cyanosis, edema.  Radials/DP/PT 2+ and equal bilaterally.  Respiratory:  Respirations regular and unlabored, clear to auscultation bilaterally. GI: Soft, nontender, nondistended, BS + x 4. MS: no deformity or atrophy. Skin: warm and dry, no rash.  Cath site clean dry intact no drainage. Neuro:  Strength and sensation are intact. Psych: Normal affect.  Accessory Clinical Findings    Recent Labs: 09/15/2020: ALT 54 09/16/2020: BUN 15; Creatinine, Ser 1.04; Hemoglobin 13.2; Platelets 123; Potassium 3.9; Sodium 139   Recent Lipid Panel No results found for: CHOL, TRIG, HDL, CHOLHDL, VLDL, LDLCALC, LDLDIRECT  ECG personally reviewed by me today-normal sinus rhythm anterior septal infarct undetermined age 69 bpm- No acute changes  Cardiac catheterization 09/15/2020    RPAV lesion is 90%  stenosed.   Ost LAD lesion is 80% stenosed.   Mid LAD lesion is 100% stenosed.LIMA to LAD is patent.   Dist LAD lesion is 60% stenosed, diffusely diseased.   Prox Cx to Mid Cx lesion is 50% stenosed.   Ost 1st Mrg to 1st Mrg lesion is  100% stenosed. SVG to OM is patent.   Ramus-1 lesion is 70% stenosed.  Appearance has improved since the prior cath.   Prox RCA to Dist RCA lesion is 100% stenosed.   SVG to RPDA with  Mid Graft lesion 75% stenosed.   A drug-eluting stent was successfully placed using a STENT ONYX FRONTIER 3.5X18.   Post intervention, there is a 0% residual stenosis.   The left ventricular systolic function is normal.   LV end diastolic pressure is normal.   The left ventricular ejection fraction is 55-65% by visual estimate.   There is no aortic valve stenosis.   Successful PCI of SVG to PDA.   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     DAPT for 12 months. Consider clopidogrel monotherapy after that time given his diffuse disease.    Results discussed with Renae [phone].   Diagnostic Dominance: Right Intervention   Assessment & Plan   1.  Coronary artery disease-denies chest pain today.  No recent episodes of arm neck back or chest discomfort.  Underwent cardiac catheterization 09/15/2020 and received PCI with DES x1 to his SVG-PDA graft.  EF was 55-65%. Continue aspirin, Brilinta, Imdur, ezetimibe, atorvastatin, metoprolol Heart healthy low-sodium diet-salty 6 given Increase physical activity as tolerated  Hyperlipidemia-LDL 50 on 08/03/20 Continue atorvastatin, ezetimibe Heart healthy low-sodium high-fiber diet.   Increase physical activity as tolerated Repeat fasting lipids and LFTs  Essential hypertension-BP today 136/68 .  Well-controlled at home. Continue metoprolol, Imdur Heart healthy low-sodium diet-salty 6 given Increase physical activity as tolerated  Paroxysmal atrial fibrillation-was noted to have postoperative atrial fibrillation.   Wore a cardiac event monitor in 2019 which did show short episodes of atrial fibrillation.  Unfortunately, he was never notified of result and he was not started on anticoagulation.  It was recommended that if he have any further episodes of irregular or accelerated heart rate DOAC be considered. Continue to monitor  Disposition: Follow-up with Dr. Herbie Baltimore in 3-4 months.  Thomasene Ripple. Evania Lyne NP-C    09/27/2020, 4:10 PM [facility] [address] Office [phone] Fax [phone]  Notice: This dictation was prepared with Dragon dictation along with smaller phrase technology. Any transcriptional errors that result from this process are unintentional and may not be corrected upon review.  I spent 14 minutes examining this patient, reviewing medications, and using patient centered shared decision making involving her cardiac care.  Prior to her visit I spent greater than 20 minutes reviewing her past medical history,  medications, and prior cardiac tests.

## 2020-09-27 ENCOUNTER — Encounter: Payer: Self-pay | Admitting: General Practice

## 2020-09-27 ENCOUNTER — Ambulatory Visit: Payer: BC Managed Care – PPO | Admitting: General Practice

## 2020-09-27 ENCOUNTER — Other Ambulatory Visit: Payer: Self-pay

## 2020-09-27 VITALS — BP 136/68 | HR 76 | Ht 67.0 in | Wt 211.6 lb

## 2020-09-27 DIAGNOSIS — I25118 Atherosclerotic heart disease of native coronary artery with other forms of angina pectoris: Secondary | ICD-10-CM

## 2020-09-27 DIAGNOSIS — E785 Hyperlipidemia, unspecified: Secondary | ICD-10-CM | POA: Diagnosis not present

## 2020-09-27 DIAGNOSIS — I480 Paroxysmal atrial fibrillation: Secondary | ICD-10-CM

## 2020-09-27 DIAGNOSIS — I10 Essential (primary) hypertension: Secondary | ICD-10-CM

## 2020-09-27 NOTE — Patient Instructions (Signed)
Medication Instructions:  The current medical regimen is effective;  continue present plan and medications as directed. Please refer to the Current Medication list given to you today.  *If you need a refill on your cardiac medications before your next appointment, please call your pharmacy*  Lab Work: NONE  Testing/Procedures: NONE  Special Instructions PLEASE READ AND FOLLOW SALTY 6-ATTACHED-1,800 mg daily  PLEASE INCREASE PHYSICAL ACTIVITY AS TOLERATED  Follow-Up: Your next appointment:  3-4 month(s) In Person with Bryan Lemma, MD   At [facility], you and your health needs are our priority.  As part of our continuing mission to provide you with exceptional heart care, we have created designated Provider Care Teams.  These Care Teams include your primary Cardiologist (physician) and Advanced Practice Providers (APPs -  Physician Assistants and Nurse Practitioners) who all work together to provide you with the care you need, when you need it.

## 2021-01-19 ENCOUNTER — Encounter: Payer: Self-pay | Admitting: Cardiology

## 2021-01-19 ENCOUNTER — Ambulatory Visit: Payer: BC Managed Care – PPO | Admitting: Cardiology

## 2021-01-19 ENCOUNTER — Other Ambulatory Visit: Payer: Self-pay

## 2021-01-19 VITALS — BP 110/80 | HR 54 | Ht 69.0 in | Wt 213.4 lb

## 2021-01-19 DIAGNOSIS — I2510 Atherosclerotic heart disease of native coronary artery without angina pectoris: Secondary | ICD-10-CM | POA: Diagnosis not present

## 2021-01-19 DIAGNOSIS — E785 Hyperlipidemia, unspecified: Secondary | ICD-10-CM

## 2021-01-19 DIAGNOSIS — R5383 Other fatigue: Secondary | ICD-10-CM

## 2021-01-19 DIAGNOSIS — R002 Palpitations: Secondary | ICD-10-CM

## 2021-01-19 DIAGNOSIS — I480 Paroxysmal atrial fibrillation: Secondary | ICD-10-CM

## 2021-01-19 DIAGNOSIS — I25118 Atherosclerotic heart disease of native coronary artery with other forms of angina pectoris: Secondary | ICD-10-CM | POA: Diagnosis not present

## 2021-01-19 DIAGNOSIS — I200 Unstable angina: Secondary | ICD-10-CM

## 2021-01-19 DIAGNOSIS — Z9861 Coronary angioplasty status: Secondary | ICD-10-CM

## 2021-01-19 MED ORDER — ASPIRIN EC 81 MG PO TBEC: 81.0000 mg | DELAYED_RELEASE_TABLET | Freq: Every day | ORAL | 0 refills | Status: DC

## 2021-01-19 MED ORDER — METOPROLOL TARTRATE 25 MG PO TABS: 25.0000 mg | ORAL_TABLET | Freq: Two times a day (BID) | ORAL | 3 refills | Status: DC

## 2021-01-19 NOTE — Progress Notes (Signed)
Primary Care Provider: Cyndi Bender, PA-C Cardiologist: Glenetta Hew, MD Electrophysiologist: None  Clinic Note: Chief Complaint  Patient presents with   Follow-up    3-41-month   Coronary Artery Disease    Unstable angina with PCI to SVG-PDA in August 2022.  No further chest pain.   ===================================  ASSESSMENT/PLAN   Problem List Items Addressed This Visit       Cardiology Problems   CAD, multiple vessel - Primary (Chronic)    Multivessel CAD no longer with any angina following PCI.  There is diffuse disease and a relatively large distribution ramus intermedius that is being treated medically.  Other vessels are grafted.  There is significant disease in the RPA V branch that is relatively small and not a target for PCI. He has 3 of 3 grafts patent 1 of which stent.  Plan: On stable dose of beta-blocker, but with some fatigue and dizziness, will back down to 25 mg twice daily and monitor for recurrence of palpitations. Wean off of Imdur Continue current dose statin Okay to DC aspirin and continue monotherapy with Brilinta to complete 1 year.      Relevant Medications   metoprolol tartrate (LOPRESSOR) 25 MG tablet   aspirin EC 81 MG tablet   Other Relevant Orders   EKG 12-Lead (Completed)   CAD S/P percutaneous coronary angioplasty (Chronic)    DES PCI to SVG-PDA in late August 2022: Okay to stop aspirin Continue monotherapy Brilinta 90 mg twice daily to complete 1 year (end of August 2022), at this point, we will need to reduce to 60 mg BID or convert to clopidogrel/Plavix 75 mg daily. With 3 cm hard to have uninterrupted Brilinta for the first year, unless surgery is urgent.  For urgent procedure, would be acceptable to hold Brilinta for 5-7 days preop as of April 2023.        Relevant Medications   metoprolol tartrate (LOPRESSOR) 25 MG tablet   aspirin EC 81 MG tablet   Hyperlipidemia LDL goal <70 (Chronic)    Labs back in July were  outstanding with LDL of 58 which is actually the bull's-eye target for patients with CAD.  No real improvement in any fatigue symptoms being off of statin.  Therefore he restarted his atorvastatin.  Plan: Continue atorvastatin 40 mg daily.  Also on Zetia.      Relevant Medications   metoprolol tartrate (LOPRESSOR) 25 MG tablet   aspirin EC 81 MG tablet   Unstable angina (HCC) (Chronic)    He is not sure if the symptoms he had when he went to the hospital at all related to angina or not.  He thinks that it may have been exposing himself to some cleaning solvent.  However following PCI, he has not had any further chest pain or pressure.  He clearly had a lesion in the vein graft to the PDA.  With no continued angina, I think we can try to wean off of Imdur.  Also with some orthostatic dizziness, would like to attempt to titrate down his beta-blocker.  Plan: Wean off Imdur by taking 1/2 tablet daily until bottle complete. Reduce metoprolol down to 1 tablet twice daily from previously dosed 1-1/2 tablet twice daily-will be 25 mg twice daily.      Relevant Medications   metoprolol tartrate (LOPRESSOR) 25 MG tablet   aspirin EC 81 MG tablet   PAF (paroxysmal atrial fibrillation) (HCC) (Chronic)    It appears that he has not really had any recurrent  episodes of A. fib since the 2019 episode.  As such, he remains on beta-blocker, but is not on a DOAC.  Partly because of his age of a 34 and being on Plavix. Continue to monitor.  There is any sign of recurrence, but probably switch from Plavix to DOAC.      Relevant Medications   metoprolol tartrate (LOPRESSOR) 25 MG tablet   aspirin EC 81 MG tablet   Other Relevant Orders   EKG 12-Lead (Completed)     Other   Rapid palpitations    No longer rarely having palpitations on current dose of beta-blocker.  In the past we have backed down his dose they recurred, probably now are far about that he will be on tolerate low-dose beta-blocker. Plan is  to reduce to 1 tablet twice daily and reassess in roughly 3 months.      Relevant Orders   EKG 12-Lead (Completed)   Fatigue due to treatment (Chronic)    No improvement following statin holiday. Overall his energy level seems to doing better.  However still will reduce beta-blocker dose back to 25 mg twice daily.  May have to go back up if palpitations recur.        ===================================  HPI:    Edgar Brooks is a 66 y.o. male with a PMH notable for CAD-CABG x3 and recent ACS with PCI to the SVG-PDA, Dilated Ascending Aorta and PAF who presents today for 2-68-month follow-up.  ELNATAN HAAKE was last seen on September 27, 2020 by Coletta Memos, PA for follow-up after ACS and PCI.  Had had 1 episode of chest pain the day after discharge relieved with Pepto-Bismol but otherwise doing well. => Discussed healthy diet and increasing physical activity.  No recurrence of A. fib.  Recent Hospitalizations: None since admission for unstable angina 09/15/2020 with cardiac catheterization and discharge.  Reviewed  CV studies:    The following studies were reviewed today: (if available, images/films reviewed: From Epic Chart or Care Everywhere) Cath-PCI 09/15/20: Ost LAD 80%, mid LAD 100% @ D2 with patent LIMA-dLAD -> distal LAD diffuse 60%. Prox-Mid LCx 50%, Ost OM1 100% with patent SVG-OM1.  Small RI 70% (better than initial Cath). Prox-distal RCA 100% CTO, RPAV 90%. SVG-rPDA 75% => DES PCI ONYX FRONTIER 3.5 X 18; EF 55-65%.     TTE :09/15/20: EF 55 to 60%.  No R WMA.  Normal diastolic function.  Normal RV.  Unable to assess RVP/PA P.  Mild biatrial enlargement.  Mild AoV calcification with no AS, and mild AI.  Normal MV and TV.[name]  Aortic root - Remains 42 mm.  Interval History:   LONZO VOSBURGH returns today overall doing pretty well.  He had some GI issues with some GERD and he esophagitis back in October but since then has been doing fine.  Other than having a little bit of  dizziness bending over and stand back up again, he has been doing pretty well.  He he says his blood pressures have been running a little low but he has been having this dizziness off and on for about a month or so.  No syncope or near syncope.  No recurrent chest pain or pressure with rest or exertion since his PCI.  No PND orthopnea edema and no melena, hematochezia or hematuria.  Back in April of last year, we had stopped his atorvastatin for a month for statin holiday and he did not notice any change in his fatigue.  CV Review  of Symptoms (Summary) Cardiovascular ROS: no chest pain or dyspnea on exertion positive for - -orthostatic dizziness negative for - edema, irregular heartbeat, orthopnea, palpitations, paroxysmal nocturnal dyspnea, rapid heart rate, shortness of breath, or syncope/near syncope or TIA/amaurosis fugax, claudication  REVIEWED OF SYSTEMS   Review of Systems  Constitutional:  Negative for malaise/fatigue and weight loss.  HENT:  Negative for nosebleeds.   Respiratory:  Negative for cough and shortness of breath.   Cardiovascular:        Per HPI  Gastrointestinal:  Negative for blood in stool.  Genitourinary:  Negative for hematuria.  Musculoskeletal:  Negative for joint pain and myalgias.  Neurological:  Positive for dizziness (With bending over and standing back up again). Negative for seizures and weakness.  Psychiatric/Behavioral: Negative.     I have reviewed and (if needed) personally updated the patient's problem list, medications, allergies, past medical and surgical history, social and family history.   PAST MEDICAL HISTORY   Past Medical History:  Diagnosis Date   Ascending aortic aneurysm 07/16/2012   4.2 cm    Coronary artery disease involving native coronary artery of native heart with angina pectoris ([place]) 07/19/2012   a)([facility]) Abnormal Nuclear ST --> pLAD ~75% calcified, Large / bifurcating RI w/ 90% medial (Diag) territory, Cx- sm OM & PLB, mRCA  80% -> s/p CABG x 3 ([facility] 2014 -> LIMA-LAD, SVG-OM1, SVG-RPDA;; b. 10/2014 ([facility]) Patent Grafts, mLAD (after sm D1) 100%, Inf Branch RI 100%, Sup RI branch (non-grafted) tandem ~80 & 65%, mRCA 100%, LCx mod Dz, dRCA - RPAV  90+%.(Med Rx); 08/2020 - DES PCI 75% SVG-rPDA   Hyperlipidemia LDL goal <70    Postoperative atrial fibrillation ([place]) 08/16/2012   Post-op CABG - Rx wiht Amiodarone; no reported recurrence.   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 Anne Shutter) LIMA-LAD, SVG-OM1 (Inf Branch of [place]), SVG-PDA    PAST SURGICAL HISTORY   Past Surgical History:  Procedure Laterality Date   CARDIAC CATHETERIZATION [name] 11/03/2014   Procedure: Left Heart Cath and Cors/Grafts Angiography;  Surgeon: Leonie Man, MD;  Location: [place] [facility];  Service: Cardiovascular;  mLAD (after sm D1) 100%, OM1/infRI - 100%, SupRI (non-grafted) 80 & 65%, Cx-OM diffuse mode Dz, mRCA 100% & RPAV 95%; Patent LIMA-LAD, SVG-RPDA, SVG-RI(OM)   CARDIAC CATHETERIZATION  07/16/2012   [facility]: 75% mLAD (after sm D`), RI - Inf branch 80%, Cx-OM moderate caliber,mRCA 80% & RPAV 95% --> CABG   CORONARY ARTERY BYPASS GRAFT  01/17/2012   3V - LIMA-LAD, SVG-Inferior RI branch (called OM1), SVG-rPDA   CORONARY STENT INTERVENTION [name] 09/15/2020   Procedure: CORONARY STENT INTERVENTION;  Surgeon: Jettie Booze, MD;  Location: [place] [facility];  Service: Cardiovascular;; SVG-rPDA 75% => DES PCI ONYX FRONTIER 3.5 X 18;   LEFT HEART CATH AND CORS/GRAFTS ANGIOGRAPHY [name] 09/15/2020   Procedure: LEFT HEART CATH AND CORS/GRAFTS ANGIOGRAPHY;  Surgeon: Jettie Booze, MD;  Location: [place] [facility];; Ost LAD 80%, mid LAD 100% @ D2 with patent LIMA-dLAD -> distal LAD diffuse 60%. Prox-Mid LCx 50%, Ost OM1 100% with patent SVG-OM1.  Small RI 70% (better than initial Cath). Prox-distal RCA 100% CTO, RPAV 90%. SVG-rPDA 75% => DES PCI; EF 55-65%.   NM MYOVIEW LTD  07/16/2012   ABNORMAL: Mod Size, Mildly Severe Basal Mid & Apical  Inferior reversible defect & ++ GXT portion with limiting Angina --> CATH   TRANSTHORACIC ECHOCARDIOGRAM  07/16/2012   [facility]: EF > 55%,  Dgen MV Dz w/ mild MR, Ao Sclerosis, Mild AI, Ascending Aorta ~4.0-4.2 cm @ sinotubular Jxn   TRANSTHORACIC ECHOCARDIOGRAM  09/15/2020   EF 55 to 60%.  No R WMA.  Normal diastolic function.  Normal RV.  Unable to assess RVP/PA P.  Mild biatrial enlargement.  Mild AoV calcification with no AS, and mild AI.  Normal MV and TV.[name]  Aortic root - Remains 42 mm.     There is no immunization history on file for this patient.  MEDICATIONS/ALLERGIES   Current Meds  Medication Sig   atorvastatin (LIPITOR) 40 MG tablet Take 40 mg by mouth daily.   esomeprazole (NEXIUM) 40 MG capsule Take 40 mg by mouth daily.   ezetimibe (ZETIA) 10 MG tablet TAKE 1 TABLET BY MOUTH EVERY DAY   Multiple Vitamins-Minerals (MULTI-DAY PLUS MINERALS) TABS Take 1 tablet by mouth daily.   nitroGLYCERIN (NITROSTAT) 0.4 MG SL tablet Place 0.4 mg under the tongue every 5 (five) minutes as needed for chest pain.   pyridOXINE (VITAMIN B-6) 50 MG tablet Take 50 mg by mouth daily.   ticagrelor (BRILINTA) 90 MG TABS tablet Take 1 tablet (90 mg total) by mouth 2 (two) times daily.   zolpidem (AMBIEN) 10 MG tablet Take 5-10 mg by mouth at bedtime as needed for sleep.   [DISCONTINUED] aspirin EC 81 MG tablet Take 81 mg daily by mouth.   [DISCONTINUED] isosorbide mononitrate (IMDUR) 30 MG 24 hr tablet Take 1 tablet (30 mg total) by mouth daily.   [DISCONTINUED] metoprolol tartrate (LOPRESSOR) 25 MG tablet TAKE 1 & 1/2 TABLETS BY MOUTH 2 TIMES A DAY. MAY TAKE AN ADDITIONAL 1/2 TABLET IF NEEDED (Patient taking differently: Take 37.5 mg by mouth See admin instructions. 37.5 mg twice daily. May take an additional 12.5 mg if needed for HR)    Allergies  Allergen Reactions   Pantoprazole     arthralgia    SOCIAL HISTORY/FAMILY HISTORY   Reviewed in Epic:  Pertinent findings:  Social History   Tobacco  Use   Smoking status: Never   Smokeless tobacco: Current    Types: Chew  Substance Use Topics   Alcohol use: Yes    Alcohol/week: 2.0 standard drinks    Types: 2 Cans of beer per week    Comment: history of heavy ETOH abuse, 1/2 gallon of whiskey every 2 days   Drug use: No   Social History   Social History Narrative   Not on file    OBJCTIVE -PE, EKG, labs   Wt Readings from Last 3 Encounters:  01/19/21 213 lb 6.4 oz (96.8 kg)  09/27/20 211 lb 9.6 oz (96 kg)  09/15/20 208 lb 15.9 oz (94.8 kg)    Physical Exam: BP 110/80 (BP Location: Left Arm)    Pulse (!) 54    Ht 5\' 9"  (1.753 m)    Wt 213 lb 6.4 oz (96.8 kg)    SpO2 98%    BMI 31.51 kg/m  Physical Exam Vitals reviewed.  Constitutional:      General: He is not in acute distress.    Appearance: Normal appearance. He is obese. He is not ill-appearing or toxic-appearing.     Comments: Well-nourished, well-groomed.  Healthy-appearing.  Just mildly obese.  HENT:     Head: Normocephalic and atraumatic.  Cardiovascular:     Rate and Rhythm: Regular rhythm. Bradycardia present.     Pulses: Normal pulses.     Heart sounds: S1 normal and S2 normal. Murmur (1/6 SEM  at RUSB.) heard.    No friction rub. No gallop.  Pulmonary:     Effort: Pulmonary effort is normal. No respiratory distress.     Breath sounds: Normal breath sounds. No wheezing, rhonchi or rales.  Chest:     Chest wall: No tenderness.  Musculoskeletal:     Cervical back: Normal range of motion and neck supple.  Neurological:     Mental Status: He is alert.  Psychiatric:        Mood and Affect: Mood normal.        Behavior: Behavior normal.        Thought Content: Thought content normal.        Judgment: Judgment normal.    Adult ECG Report  Rate: 54 ;  Rhythm: sinus bradycardia and cannot rule out anterior Mikan age-indeterminate.  Otherwise normal axis, intervals and durations. ;   Narrative Interpretation: Stable   Recent Labs: reviewed 08/03/2020: TC  99, TG 82, HDL 30, LDL 50.  Lab Results  Component Value Date   CREATININE 1.04 09/16/2020   BUN 15 09/16/2020   NA 139 09/16/2020   K 3.9 09/16/2020   CL 103 09/16/2020   CO2 26 09/16/2020   CBC Latest Ref Rng & Units 09/16/2020 09/15/2020 09/15/2020  WBC 4.0 - 10.5 K/uL 5.6 7.2 4.5  Hemoglobin 13.0 - 17.0 g/dL 13.2 13.6 13.0  Hematocrit 39.0 - 52.0 % 39.1 39.7 39.1  Platelets 150 - 400 K/uL 123(L) 135(L) 127(L)    Lab Results  Component Value Date   TSH 1.19 05/27/2015    ==================================================  COVID-19 Education: The signs and symptoms of COVID-19 were discussed with the patient and how to seek care for testing (follow up with PCP or arrange E-visit).    I spent a total of 17 minutes with the patient spent in direct patient consultation.  Additional time spent with chart review  / charting (studies, outside notes, etc): 15 min Total Time: 32 min  Current medicines are reviewed at length with the patient today.  (+/- concerns) n/a  This visit occurred during the SARS-CoV-2 public health emergency.  Safety protocols were in place, including screening questions prior to the visit, additional usage of staff PPE, and extensive cleaning of exam room while observing appropriate contact time as indicated for disinfecting solutions.  Notice: This dictation was prepared with Dragon dictation along with smart phrase technology. Any transcriptional errors that result from this process are unintentional and may not be corrected upon review.  Studies Ordered:   Orders Placed This Encounter  Procedures   EKG 12-Lead    Patient Instructions / Medication Changes & Studies & Tests Ordered   Patient Instructions  Medication Instructions:   Continue taking Brilinta    Decrease Metoprolol tartrate to 25 mg  twice a day - may take an additional 12.5 mg idf needed for increase heart rate.   Stop taking Aspirin 81 mg  March 16, 2021  Wean off taking  Isosorbide mononitrate- take  1/2 tablet of 30 mg everyday until the bottle is empty    *If you need a refill on your cardiac medications before your next appointment, please call your pharmacy*   Lab Work: Not needed   Testing/Procedures: Not needed   Follow-Up: At [facility], you and your health needs are our priority.  As part of our continuing mission to provide you with exceptional heart care, we have created designated Provider Care Teams.  These Care Teams include your primary Cardiologist (physician)  and Advanced Practice Providers (APPs -  Physician Assistants and Nurse Practitioners) who all work together to provide you with the care you need, when you need it.     Your next appointment:   7 month(s)  The format for your next appointment:   In Person  Provider:   Glenetta Hew, MD         Glenetta Hew, M.D., M.S. Interventional Cardiologist   Pager # [phone] Phone # [phone] [address] [place], [place] [address]   Thank you for choosing Heartcare at [facility]!!

## 2021-01-19 NOTE — Patient Instructions (Addendum)
Medication Instructions:   Continue taking Brilinta    Decrease Metoprolol tartrate to 25 mg  twice a day - may take an additional 12.5 mg idf needed for increase heart rate.   Stop taking Aspirin 81 mg  March 16, 2021  Wean off taking Isosorbide mononitrate- take  1/2 tablet of 30 mg everyday until the bottle is empty    *If you need a refill on your cardiac medications before your next appointment, please call your pharmacy*   Lab Work: Not needed   Testing/Procedures: Not needed   Follow-Up: At [facility], you and your health needs are our priority.  As part of our continuing mission to provide you with exceptional heart care, we have created designated Provider Care Teams.  These Care Teams include your primary Cardiologist (physician) and Advanced Practice Providers (APPs -  Physician Assistants and Nurse Practitioners) who all work together to provide you with the care you need, when you need it.     Your next appointment:   7 month(s)  The format for your next appointment:   In Person  Provider:   Bryan Lemma, MD

## 2021-01-23 ENCOUNTER — Encounter: Payer: Self-pay | Admitting: Cardiology

## 2021-01-23 NOTE — Assessment & Plan Note (Signed)
Labs back in July were outstanding with LDL of 58 which is actually the bull's-eye target for patients with CAD.  No real improvement in any fatigue symptoms being off of statin.  Therefore he restarted his atorvastatin.  Plan: Continue atorvastatin 40 mg daily.  Also on Zetia.

## 2021-01-23 NOTE — Assessment & Plan Note (Addendum)
DES PCI to SVG-PDA in late August 2022:  Okay to stop aspirin  Continue monotherapy Brilinta 90 mg twice daily to complete 1 year (end of August 2022), at this point, we will need to reduce to 60 mg BID or convert to clopidogrel/Plavix 75 mg daily.  With 3 cm hard to have uninterrupted Brilinta for the first year, unless surgery is urgent.  For urgent procedure, would be acceptable to hold Brilinta for 5-7 days preop as of April 2023.

## 2021-01-23 NOTE — Assessment & Plan Note (Signed)
It appears that he has not really had any recurrent episodes of A. fib since the 2019 episode.  As such, he remains on beta-blocker, but is not on a DOAC.  Partly because of his age of a 10 and being on Plavix. Continue to monitor.  There is any sign of recurrence, but probably switch from Plavix to DOAC.

## 2021-01-23 NOTE — Assessment & Plan Note (Signed)
No longer rarely having palpitations on current dose of beta-blocker.  In the past we have backed down his dose they recurred, probably now are far about that he will be on tolerate low-dose beta-blocker. Plan is to reduce to 1 tablet twice daily and reassess in roughly 3 months.

## 2021-01-23 NOTE — Assessment & Plan Note (Signed)
No improvement following statin holiday. Overall his energy level seems to doing better.  However still will reduce beta-blocker dose back to 25 mg twice daily.  May have to go back up if palpitations recur.

## 2021-01-23 NOTE — Assessment & Plan Note (Signed)
Multivessel CAD no longer with any angina following PCI.  There is diffuse disease and a relatively large distribution ramus intermedius that is being treated medically.  Other vessels are grafted.  There is significant disease in the RPA V branch that is relatively small and not a target for PCI. He has 3 of 3 grafts patent 1 of which stent.  Plan:  On stable dose of beta-blocker, but with some fatigue and dizziness, will back down to 25 mg twice daily and monitor for recurrence of palpitations.  Wean off of Imdur  Continue current dose statin  Okay to DC aspirin and continue monotherapy with Brilinta to complete 1 year.

## 2021-01-23 NOTE — Assessment & Plan Note (Signed)
He is not sure if the symptoms he had when he went to the hospital at all related to angina or not.  He thinks that it may have been exposing himself to some cleaning solvent.  However following PCI, he has not had any further chest pain or pressure.  He clearly had a lesion in the vein graft to the PDA.  With no continued angina, I think we can try to wean off of Imdur.  Also with some orthostatic dizziness, would like to attempt to titrate down his beta-blocker.  Plan: Wean off Imdur by taking 1/2 tablet daily until bottle complete.  Reduce metoprolol down to 1 tablet twice daily from previously dosed 1-1/2 tablet twice daily-will be 25 mg twice daily.

## 2021-04-25 ENCOUNTER — Other Ambulatory Visit: Payer: Self-pay | Admitting: Cardiology

## 2021-07-17 NOTE — Progress Notes (Signed)
Labs from PCP 02/08/2021: CBC: W5.4, H/H15.0/44.3, PLT 142. CMP: NA 146, K+ 4.6, CL 107, CO2 25, BUN 13, CR 0.98, GLU 102, CA 9.2, T protein 6.9, albumin 4.7, AP 59, AST 28, ALT 43 Lipids: TC 96, TG 105, HDL 35, LDL 41.  Outstanding.   Bryan Lemma, MD

## 2021-10-03 ENCOUNTER — Ambulatory Visit: Payer: BC Managed Care – PPO | Attending: Cardiology | Admitting: Cardiology

## 2021-10-03 ENCOUNTER — Encounter: Payer: Self-pay | Admitting: Cardiology

## 2021-10-03 VITALS — BP 106/70 | HR 60 | Ht 69.0 in | Wt 211.6 lb

## 2021-10-03 DIAGNOSIS — I4891 Unspecified atrial fibrillation: Secondary | ICD-10-CM

## 2021-10-03 DIAGNOSIS — E785 Hyperlipidemia, unspecified: Secondary | ICD-10-CM | POA: Diagnosis not present

## 2021-10-03 DIAGNOSIS — I2510 Atherosclerotic heart disease of native coronary artery without angina pectoris: Secondary | ICD-10-CM

## 2021-10-03 DIAGNOSIS — I25708 Atherosclerosis of coronary artery bypass graft(s), unspecified, with other forms of angina pectoris: Secondary | ICD-10-CM

## 2021-10-03 DIAGNOSIS — Z9861 Coronary angioplasty status: Secondary | ICD-10-CM

## 2021-10-03 DIAGNOSIS — R42 Dizziness and giddiness: Secondary | ICD-10-CM

## 2021-10-03 DIAGNOSIS — I9789 Other postprocedural complications and disorders of the circulatory system, not elsewhere classified: Secondary | ICD-10-CM

## 2021-10-03 MED ORDER — NITROGLYCERIN 0.4 MG SL SUBL: 0.4000 mg | SUBLINGUAL_TABLET | SUBLINGUAL | 3 refills | Status: DC | PRN

## 2021-10-03 MED ORDER — TICAGRELOR 60 MG PO TABS: 60.0000 mg | ORAL_TABLET | Freq: Two times a day (BID) | ORAL | 3 refills | Status: DC

## 2021-10-03 NOTE — Patient Instructions (Addendum)
Medication Instructions:  Stop Brilinta 90 mg after current bottle is complete  Then start Brilinta 60 mg  twice a day    *If you need a refill on your cardiac medications before your next appointment, please call your pharmacy*   Lab Work: Not needed      Testing/Procedures:  Not needed  Follow-Up: At [facility], you and your health needs are our priority.  As part of our continuing mission to provide you with exceptional heart care, we have created designated Provider Care Teams.  These Care Teams include your primary Cardiologist (physician) and Advanced Practice Providers (APPs -  Physician Assistants and Nurse Practitioners) who all work together to provide you with the care you need, when you need it.  We recommend signing up for the patient portal called "MyChart".  Sign up information is provided on this After Visit Summary.  MyChart is used to connect with patients for Virtual Visits (Telemedicine).  Patients are able to view lab/test results, encounter notes, upcoming appointments, etc.  Non-urgent messages can be sent to your provider as well.   To learn more about what you can do with MyChart, go to [email or web address].    Your next appointment:   12 month(s)  The format for your next appointment:   In Person  Provider:   Glenetta Hew, MD

## 2021-10-03 NOTE — Progress Notes (Signed)
Primary Care Provider: Arlyss Queenonroy, Nathan, PA-C [facility] Cardiologist: Bryan Lemmaavid Jaleeyah Munce, MD Electrophysiologist: None  Clinic Note: Chief Complaint  Patient presents with   Follow-up    Delayed 4278-month (8 months)-doing well.  (1 year out from PCI)   Coronary Artery Disease    1 year out from PCI to SVG-RPDA.  No angina.   ===================================  ASSESSMENT/PLAN   Problem List Items Addressed This Visit       Cardiology Problems   CAD S/P percutaneous coronary angioplasty (Chronic)    Presented with ACS/unstable angina in late August 2022 (09/15/2020)-found to have severe disease in SVG and RPDA treated with DES stent.  No further angina since that episode.  Has been doing well.  Staying active.  Aspirin was discontinued as of January 2023  Plan: Complete current bottle of Brilinta and reduce to 60 mg twice daily going forward. He was still taking aspirin-was supposed to be discontinued-we will discontinue at this point. Okay to hold Brilinta 5 to 7 days preop for surgeries or procedures. Okay to hold Brilinta 3 to 5 days for significant reading or bruising. Continue low-dose Lopressor 25 mg twice daily. Continue current dose of Lipitor and Zetia.  Lipids well controlled.      Relevant Medications   nitroGLYCERIN (NITROSTAT) 0.4 MG SL tablet   Other Relevant Orders   EKG 12-Lead (Completed)   CAD, multiple vessel - Primary (Chronic)    Severe RCA LAD and RI disease -> 3V CABG.  Now has stent in 1 vein graft. No further angina or heart failure symptoms.  Plan: Continue current dose of Lipitor and Zetia along with low-dose Lopressor. Converting from treatment dose to maintenance dose of Brilinta-reducing to 60 mg twice daily and stopping aspirin.  Next evaluation would be due in fall of 2026      Relevant Medications   nitroGLYCERIN (NITROSTAT) 0.4 MG SL tablet   Other Relevant Orders   EKG 12-Lead (Completed)   Hyperlipidemia LDL goal <70  (Chronic)    Labs were last checked in June-excellent control.  Tolerating current dose of Lipitor and Zetia with no issues.  He tried a statin holiday with no change in symptoms.  As such, we will continue on current meds.  Will be due for labs to be checked by PCP again in January 2024.      Relevant Medications   nitroGLYCERIN (NITROSTAT) 0.4 MG SL tablet   Postoperative atrial fibrillation (HCC) (Chronic)    No breakthrough since his CABG.  Monitor for symptoms, but as such would avoid DOAC.  He is on baseline beta-blocker with adequate rate control.      Relevant Medications   nitroGLYCERIN (NITROSTAT) 0.4 MG SL tablet   Other Relevant Orders   EKG 12-Lead (Completed)     Other   Dizzy spells (Chronic)    Relatively infrequent spells nowadays, but they seem to be related to dehydration with poor p.o. intake and often times when he is overtired.  Plan: Encourage adequate hydration.  He is trying to keep up with his daily electrolyte drinks, especially when he is outside working in the heat.  He realizes that he needs to be prehydrated before going out to the heat.  He also needs to be eating something in which case he gets in the drink water otherwise the electrolytes cocktails are helpful.      ===================================  HPI:    Edgar Brooks is a 66 y.o. male with a PMH below who presents today  for 70-month follow-up. He returns today at the request of Lonie Peak, Cordelia Poche.  Pertinent PMH CAD-CABG x3 (LIMA-LAD, SVG-OM, SVG-RPDA) ACS 09/15/2020: PCI SVG-PDA Postop A-fib -> no recurrence.  Not on OAC. Dilated ascending aorta HTN, HLD  Edgar Brooks was last seen on January 19, 2021-was doing well besides some GI issues with GERD and esophagitis.  Some dizziness with bending over and then standing up quickly.  BPs have been running a little low and he was having a little more dizziness but no syncope or near syncope.  Indicated that he had done 1 month statin  holiday back in April 2022 and did not notice any change in his fatigue symptoms. Decrease Metoprolol 25 mg BID (1/2 tab prn); Brilinta -continue, stop ASA 03/16/21; wean off Imdur  Recent Hospitalizations: None  Reviewed  CV studies:    The following studies were reviewed today: (if available, images/films reviewed: From Epic Chart or Care Everywhere) None:  Interval History:   Edgar Brooks returns today overall doing pretty well.  He is not having any further episodes of chest pain or pressure with rest or exertion.  He says his PCP just check labs and we will have them available.  The last labs we have was from January.  He says that he has been doing okay on his meds.  No angina or heart failure symptoms.  No arrhythmia symptoms.  Nothing to suggest recurrence of A-fib. He just has episodes every now and then of dizziness and lightheadedness which tend to be associated with being dehydrated.  He feels his heart rate going fast and feels dizzy.  Usually resolves with drinking still eating something.  He feels much better.  Has not had any syncope or near syncope.  Otherwise no palpitations.  CV Review of Symptoms (Summary): no chest pain or dyspnea on exertion positive for - occasional episodes of dizziness and lightheadedness not associated with syncope or near syncope.  Related to dehydration. negative for - edema, irregular heartbeat, orthopnea, palpitations, paroxysmal nocturnal dyspnea, rapid heart rate, shortness of breath, or TIA or amaurosis fugax, claudication  REVIEWED OF SYSTEMS   Review of Systems  Constitutional:  Negative for malaise/fatigue and weight loss.  HENT:  Negative for congestion.   Respiratory:  Negative for cough and shortness of breath.   Cardiovascular:        Per HPI  Gastrointestinal:  Negative for abdominal pain, blood in stool, constipation, diarrhea, heartburn and melena.  Genitourinary:  Negative for hematuria.  Musculoskeletal:  Negative for  falls, joint pain and myalgias.  Neurological:  Positive for dizziness. Negative for loss of consciousness and weakness.  Psychiatric/Behavioral: Negative.    All other systems reviewed and are negative.  I have reviewed and (if needed) personally updated the patient's problem list, medications, allergies, past medical and surgical history, social and family history.   PAST MEDICAL HISTORY   Past Medical History:  Diagnosis Date   Ascending aortic aneurysm (HCC) 07/16/2012   4.2 cm    Coronary artery disease involving native coronary artery of native heart with angina pectoris (HCC) 07/19/2012   a)([facility]) Abnormal Nuclear ST --> pLAD ~75% calcified, Large / bifurcating RI w/ 90% medial (Diag) territory, Cx- sm OM & PLB, mRCA 80% -> s/p CABG x 3 ([facility] 2014 -> LIMA-LAD, SVG-OM1, SVG-RPDA;; b. 10/2014 ([facility]) Patent Grafts, mLAD (after sm D1) 100%, Inf Branch RI 100%, Sup RI branch (non-grafted) tandem ~80 & 65%, mRCA 100%, LCx mod Dz, dRCA - RPAV  90+%.(Med  Rx); 08/2020 - DES PCI 75% SVG-rPDA   Hyperlipidemia LDL goal <70    Postoperative atrial fibrillation (HCC) 08/16/2012   Post-op CABG - Rx wiht Amiodarone; no reported recurrence.   S/P CABG x 3 08/22/2012   [facility] - Dr. Remo Lipps) LIMA-LAD, SVG-OM1 (Inf Branch of [place]), SVG-PDA    PAST SURGICAL HISTORY   Past Surgical History:  Procedure Laterality Date   CARDIAC CATHETERIZATION [name] 11/03/2014   Procedure: Left Heart Cath and Cors/Grafts Angiography;  Surgeon: Marykay Lex, MD;  Location: [facility] INVASIVE CV LAB;  Service: Cardiovascular;  mLAD (after sm D1) 100%, OM1/infRI - 100%, SupRI (non-grafted) 80 & 65%, Cx-OM diffuse mode Dz, mRCA 100% & RPAV 95%; Patent LIMA-LAD, SVG-RPDA, SVG-RI(OM)   CARDIAC CATHETERIZATION  07/16/2012   [facility]: 75% mLAD (after sm D`), RI - Inf branch 80%, Cx-OM moderate caliber,mRCA 80% & RPAV 95% --> CABG   CORONARY ARTERY BYPASS GRAFT  01/17/2012   3V - LIMA-LAD, SVG-Inferior RI branch (called OM1), SVG-rPDA    CORONARY STENT INTERVENTION [name] 09/15/2020   Procedure: CORONARY STENT INTERVENTION;  Surgeon: Corky Crafts, MD;  Location: [facility] INVASIVE CV LAB;  Service: Cardiovascular;; SVG-rPDA 75% => DES PCI ONYX FRONTIER 3.5 X 18;   LEFT HEART CATH AND CORS/GRAFTS ANGIOGRAPHY [name] 09/15/2020   Procedure: LEFT HEART CATH AND CORS/GRAFTS ANGIOGRAPHY;  Surgeon: Corky Crafts, MD;  Location: [facility] INVASIVE CV LAB;; Ost LAD 80%, mid LAD 100% @ D2 with patent LIMA-dLAD -> distal LAD diffuse 60%. Prox-Mid LCx 50%, Ost OM1 100% with patent SVG-OM1.  Small RI 70% (better than initial Cath). Prox-distal RCA 100% CTO, RPAV 90%. SVG-rPDA 75% => DES PCI; EF 55-65%.   NM MYOVIEW LTD  07/16/2012   ABNORMAL: Mod Size, Mildly Severe Basal Mid & Apical Inferior reversible defect & ++ GXT portion with limiting Angina --> CATH   TRANSTHORACIC ECHOCARDIOGRAM  07/16/2012   [facility]: EF > 55%, Dgen MV Dz w/ mild MR, Ao Sclerosis, Mild AI, Ascending Aorta ~4.0-4.2 cm @ sinotubular Jxn   TRANSTHORACIC ECHOCARDIOGRAM  09/15/2020   EF 55 to 60%.  No R WMA.  Normal diastolic function.  Normal RV.  Unable to assess RVP/PA P.  Mild biatrial enlargement.  Mild AoV calcification with no AS, and mild AI.  Normal MV and TV.[name]  Aortic root - Remains 42 mm.   Cath-PCI 09/15/20: Ost LAD 80%, mid LAD 100% @ D2 with patent LIMA-dLAD -> distal LAD diffuse 60%. Prox-Mid LCx 50%, Ost OM1 100% with patent SVG-OM1.  Small RI 70% (better than initial Cath). Prox-distal RCA 100% CTO, RPAV 90%. SVG-rPDA 75% => DES PCI ONYX FRONTIER 3.5 X 18; EF 55-65%.  TTE :09/15/20: EF 55 to 60%.  No R WMA.      There is no immunization history on file for this patient.  MEDICATIONS/ALLERGIES   Current Meds  Medication Sig   atorvastatin (LIPITOR) 40 MG tablet Take 40 mg by mouth daily.   esomeprazole (NEXIUM) 40 MG capsule Take 40 mg by mouth daily.   ezetimibe (ZETIA) 10 MG tablet TAKE 1 TABLET BY MOUTH EVERY DAY   metoprolol tartrate (LOPRESSOR) 25 MG tablet  Take 1 tablet (25 mg total) by mouth 2 (two) times daily. May take an additional 12.5 mg  ( 1/2 tablet)  if needed for increase heart rate   Multiple Vitamins-Minerals (MULTI-DAY PLUS MINERALS) TABS Take 1 tablet by mouth daily.   ticagrelor (BRILINTA) 90 MG TABS tablet Take 1 tablet (90 mg total) by mouth 2 (  two) times daily.   zolpidem (AMBIEN) 10 MG tablet Take 5-10 mg by mouth at bedtime as needed for sleep.    Allergies  Allergen Reactions   Pantoprazole     arthralgia    SOCIAL HISTORY/FAMILY HISTORY   Reviewed in Epic:  Pertinent findings:  Social History   Tobacco Use   Smoking status: Never   Smokeless tobacco: Current    Types: Chew  Substance Use Topics   Alcohol use: Yes    Alcohol/week: 2.0 standard drinks of alcohol    Types: 2 Cans of beer per week    Comment: history of heavy ETOH abuse, 1/2 gallon of whiskey every 2 days   Drug use: No   Social History   Social History Narrative   Not on file    OBJCTIVE -PE, EKG, labs   Wt Readings from Last 3 Encounters:  10/03/21 211 lb 9.6 oz (96 kg)  01/19/21 213 lb 6.4 oz (96.8 kg)  09/27/20 211 lb 9.6 oz (96 kg)    Physical Exam: BP 106/70 (BP Location: Left Arm, Patient Position: Sitting, Cuff Size: Large)   Pulse 60   Ht  (1.753 m)   Wt 211 lb 9.6 oz (96 kg)   SpO2 95%   BMI 31.25 kg/m  Physical Exam Vitals reviewed.  Constitutional:      General: He is not in acute distress.    Appearance: Normal appearance. He is obese. He is not ill-appearing or toxic-appearing.  HENT:     Head: Normocephalic and atraumatic.  Neck:     Vascular: No carotid bruit or JVD.  Cardiovascular:     Rate and Rhythm: Normal rate and regular rhythm. No extrasystoles are present.    Chest Wall: PMI is not displaced.     Pulses: Normal pulses.     Heart sounds: S1 normal and S2 normal. No midsystolic click. Murmur (1/6 SEM at RUSB) heard.     No gallop.  Pulmonary:     Effort: Pulmonary effort is normal. No  respiratory distress.     Breath sounds: Normal breath sounds. No wheezing, rhonchi or rales.  Chest:     Chest wall: No tenderness.  Musculoskeletal:        General: No swelling. Normal range of motion.     Cervical back: Normal range of motion and neck supple.  Skin:    General: Skin is warm and dry.  Neurological:     General: No focal deficit present.     Mental Status: He is alert and oriented to person, place, and time.  Psychiatric:        Mood and Affect: Mood normal.        Behavior: Behavior normal.        Thought Content: Thought content normal.        Judgment: Judgment normal.     Adult ECG Report  Rate: 60;  Rhythm: normal sinus rhythm and normal axis, intervals and durations. ;   Narrative Interpretation: Stable/normal  Recent Labs:  recently checked by PCP  02/08/2021: Na+ 146, K+ 4.6, Cl- 107, HCO3-25, BUN 13, Cr 098, Glu 102, Ca2+ 9.2; AST 28, ALT 43, AlkP 59 CBC: W 5.4, H/H 15/44.3, Plt 142 TC 96, TG 103, HDL 35, LDL 41**at goal.  Plan   Lab Results  Component Value Date   CREATININE 1.04 09/16/2020   BUN 15 09/16/2020   NA 139 09/16/2020   K 3.9 09/16/2020   CL 103 09/16/2020   CO2  26 09/16/2020      Latest Ref Rng & Units 09/16/2020    2:43 AM 09/15/2020    5:56 AM 09/15/2020    2:26 AM  CBC  WBC 4.0 - 10.5 K/uL 5.6  7.2  4.5   Hemoglobin 13.0 - 17.0 g/dL 13.2  13.6  13.0   Hematocrit 39.0 - 52.0 % 39.1  39.7  39.1   Platelets 150 - 400 K/uL 123  135  127     No results found for: "HGBA1C" Lab Results  Component Value Date   TSH 1.19 05/27/2015    ================================================== I spent a total of 20 minutes with the patient spent in direct patient consultation.  Additional time spent with chart review  / charting (studies, outside notes, etc): 16 min Total Time: 36 min  Current medicines are reviewed at length with the patient today.  (+/- concerns) [name]  Notice: This dictation was prepared with Dragon dictation along  with smart phrase technology. Any transcriptional errors that result from this process are unintentional and may not be corrected upon review.  Studies Ordered:   Orders Placed This Encounter  Procedures   EKG 12-Lead   Meds ordered this encounter  Medications   nitroGLYCERIN (NITROSTAT) 0.4 MG SL tablet    Sig: Place 1 tablet (0.4 mg total) under the tongue every 5 (five) minutes as needed for chest pain.    Dispense:  25 tablet    Refill:  3   ticagrelor (BRILINTA) 60 MG TABS tablet    Sig: Take 1 tablet (60 mg total) by mouth 2 (two) times daily.    Dispense:  180 tablet    Refill:  3    Discontinue 90 mg  prescription    Patient Instructions / Medication Changes & Studies & Tests Ordered   Patient Instructions  Medication Instructions:  Stop Brilinta 90 mg after current bottle is complete  Then start Brilinta 60 mg  twice a day    *If you need a refill on your cardiac medications before your next appointment, please call your pharmacy*   Lab Work: Not needed      Testing/Procedures:  Not needed  Follow-Up: At [facility], you and your health needs are our priority.  As part of our continuing mission to provide you with exceptional heart care, we have created designated Provider Care Teams.  These Care Teams include your primary Cardiologist (physician) and Advanced Practice Providers (APPs -  Physician Assistants and Nurse Practitioners) who all work together to provide you with the care you need, when you need it.  We recommend signing up for the patient portal called "MyChart".  Sign up information is provided on this After Visit Summary.  MyChart is used to connect with patients for Virtual Visits (Telemedicine).  Patients are able to view lab/test results, encounter notes, upcoming appointments, etc.  Non-urgent messages can be sent to your provider as well.   To learn more about what you can do with MyChart, go to [email or web address].    Your next  appointment:   12 month(s)  The format for your next appointment:   In Person  Provider:   Glenetta Hew, MD       Leonie Man, MD, MS Glenetta Hew, M.D., M.S. Interventional Cardiologist  [place]  Pager # [phone] Phone # [phone] [address] [place], [place] [address]   Thank you for choosing [place] [facility]!!

## 2021-10-16 ENCOUNTER — Encounter: Payer: Self-pay | Admitting: Cardiology

## 2021-10-16 DIAGNOSIS — R42 Dizziness and giddiness: Secondary | ICD-10-CM | POA: Insufficient documentation

## 2021-10-16 NOTE — Assessment & Plan Note (Signed)
No breakthrough since his CABG.  Monitor for symptoms, but as such would avoid DOAC.  He is on baseline beta-blocker with adequate rate control.

## 2021-10-16 NOTE — Assessment & Plan Note (Signed)
Labs were last checked in June-excellent control.  Tolerating current dose of Lipitor and Zetia with no issues.  He tried a statin holiday with no change in symptoms.  As such, we will continue on current meds.  Will be due for labs to be checked by PCP again in January 2024.

## 2021-10-16 NOTE — Assessment & Plan Note (Signed)
Severe RCA LAD and RI disease -> 3V CABG.  Now has stent in 1 vein graft. No further angina or heart failure symptoms.  Plan:  Continue current dose of Lipitor and Zetia along with low-dose Lopressor.  Converting from treatment dose to maintenance dose of Brilinta-reducing to 60 mg twice daily and stopping aspirin.   Next evaluation would be due in fall of 2026

## 2021-10-16 NOTE — Assessment & Plan Note (Signed)
Relatively infrequent spells nowadays, but they seem to be related to dehydration with poor p.o. intake and often times when he is overtired.  Plan: Encourage adequate hydration.  He is trying to keep up with his daily electrolyte drinks, especially when he is outside working in the heat.  He realizes that he needs to be prehydrated before going out to the heat.  He also needs to be eating something in which case he gets in the drink water otherwise the electrolytes cocktails are helpful.

## 2021-10-16 NOTE — Assessment & Plan Note (Addendum)
Presented with ACS/unstable angina in late August 2022 (09/15/2020)-found to have severe disease in SVG and RPDA treated with DES stent.  No further angina since that episode.  Has been doing well.  Staying active.  Aspirin was discontinued as of January 2023  Plan:  Complete current bottle of Brilinta and reduce to 60 mg twice daily going forward.  He was still taking aspirin-was supposed to be discontinued-we will discontinue at this point.  Okay to hold Brilinta 5 to 7 days preop for surgeries or procedures.  Okay to hold Brilinta 3 to 5 days for significant reading or bruising.  Continue low-dose Lopressor 25 mg twice daily.  Continue current dose of Lipitor and Zetia.  Lipids well controlled.

## 2022-04-27 ENCOUNTER — Other Ambulatory Visit: Payer: Self-pay | Admitting: Cardiology

## 2022-08-02 ENCOUNTER — Other Ambulatory Visit: Payer: Self-pay | Admitting: Cardiology

## 2022-08-12 LAB — LAB REPORT - SCANNED: EGFR: 68

## 2022-10-02 NOTE — Progress Notes (Unsigned)
Cardiology Clinic Note   Patient Name: Edgar Brooks Date of Encounter: 10/05/2022  Primary Care Provider:  Lonie Peak, PA-C Primary Cardiologist:  Bryan Lemma, MD  Patient Profile    67 year old male with history of severe RCA, LAD, and RI disease, status post three-vessel CABG  (LIMA-LAD, SVG-OM, SVG-RPDA) ACS 09/15/2020: PCI SVG-PDA with stent SVG to RPDA, September 15, 2020 hyperlipidemia, postoperative atrial fibrillation not on DOAC, and chronic dizzy spells.   Past Medical History    Past Medical History:  Diagnosis Date   Ascending aortic aneurysm (HCC) 07/16/2012   4.2 cm    Coronary artery disease involving native coronary artery of native heart with angina pectoris (HCC) 07/19/2012   a)([facility]) Abnormal Nuclear ST --> pLAD ~75% calcified, Large / bifurcating RI w/ 90% medial (Diag) territory, Cx- sm OM & PLB, mRCA 80% -> s/p CABG x 3 ([facility] 2014 -> LIMA-LAD, SVG-OM1, SVG-RPDA;; b. 10/2014 ([facility]) Patent Grafts, mLAD (after sm D1) 100%, Inf Branch RI 100%, Sup RI branch (non-grafted) tandem ~80 & 65%, mRCA 100%, LCx mod Dz, dRCA - RPAV  90+%.(Med Rx); 08/2020 - DES PCI 75% SVG-rPDA   Hyperlipidemia LDL goal <70    Postoperative atrial fibrillation (HCC) 08/16/2012   Post-op CABG - Rx wiht Amiodarone; no reported recurrence.   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 Remo Lipps) LIMA-LAD, SVG-OM1 (Inf [place] of [place]), SVG-PDA   Past Surgical History:  Procedure Laterality Date   CARDIAC CATHETERIZATION [name] 11/03/2014   Procedure: Left Heart Cath and Cors/Grafts Angiography;  Surgeon: Marykay Lex, MD;  Location: [facility] INVASIVE CV LAB;  Service: Cardiovascular;  mLAD (after sm D1) 100%, OM1/infRI - 100%, SupRI (non-grafted) 80 & 65%, Cx-OM diffuse mode Dz, mRCA 100% & RPAV 95%; Patent LIMA-LAD, SVG-RPDA, SVG-RI(OM)   CARDIAC CATHETERIZATION  07/16/2012   [facility]: 75% mLAD (after sm D`), RI - Inf branch 80%, Cx-OM moderate caliber,mRCA 80% & RPAV 95% --> CABG   CORONARY ARTERY BYPASS GRAFT   01/17/2012   3V - LIMA-LAD, SVG-Inferior RI branch (called OM1), SVG-rPDA   CORONARY STENT INTERVENTION [name] 09/15/2020   Procedure: CORONARY STENT INTERVENTION;  Surgeon: Corky Crafts, MD;  Location: [facility] INVASIVE CV LAB;  Service: Cardiovascular;; SVG-rPDA 75% => DES PCI ONYX FRONTIER 3.5 X 18;   LEFT HEART CATH AND CORS/GRAFTS ANGIOGRAPHY [name] 09/15/2020   Procedure: LEFT HEART CATH AND CORS/GRAFTS ANGIOGRAPHY;  Surgeon: Corky Crafts, MD;  Location: [facility] INVASIVE CV LAB;; Ost LAD 80%, mid LAD 100% @ D2 with patent LIMA-dLAD -> distal LAD diffuse 60%. Prox-Mid LCx 50%, Ost OM1 100% with patent SVG-OM1.  Small RI 70% (better than initial Cath). Prox-distal RCA 100% CTO, RPAV 90%. SVG-rPDA 75% => DES PCI; EF 55-65%.   NM MYOVIEW LTD  07/16/2012   ABNORMAL: Mod Size, Mildly Severe Basal Mid & Apical Inferior reversible defect & ++ GXT portion with limiting Angina --> CATH   TRANSTHORACIC ECHOCARDIOGRAM  07/16/2012   [facility]: EF > 55%, Dgen MV Dz w/ mild MR, Ao Sclerosis, Mild AI, Ascending Aorta ~4.0-4.2 cm @ sinotubular Jxn   TRANSTHORACIC ECHOCARDIOGRAM  09/15/2020   EF 55 to 60%.  No R WMA.  Normal diastolic function.  Normal RV.  Unable to assess RVP/PA P.  Mild biatrial enlargement.  Mild AoV calcification with no AS, and mild AI.  Normal MV and TV.[name]  Aortic root - Remains 42 mm.    Allergies  Allergies  Allergen Reactions   Pantoprazole     arthralgia  History of Present Illness    Mr. Edgar Brooks comes today for ongoing assessment and management of CAD.  Hyperlipidemia, hypertension.  He comes today with complaints of having no energy over the last 2 months.  He stopped dipping Scoal  snuff after 2 months, and since stopping he has had lower energy.  He states he used to be able to get up and go from 7 AM till late evening but now he is sleeping until 10 AM and feels "lazy" the rest of the day.  He is easily tired with minimal exertion.  He has gained some weight.  He has seen  his primary care provider who has done labs, CBC was normal with the exception of mildly decreased platelets of 141, normal testosterone, TSH was pending.  Total cholesterol was normal.  Home Medications    Current Outpatient Medications  Medication Sig Dispense Refill   atorvastatin (LIPITOR) 40 MG tablet Take 40 mg by mouth daily.     esomeprazole (NEXIUM) 40 MG capsule Take 40 mg by mouth daily.     ezetimibe (ZETIA) 10 MG tablet TAKE 1 TABLET BY MOUTH EVERY DAY 90 tablet 3   metoprolol tartrate (LOPRESSOR) 25 MG tablet Take 0.5 tablets (12.5 mg total) by mouth 2 (two) times daily. 180 tablet 3   Multiple Vitamins-Minerals (MULTI-DAY PLUS MINERALS) TABS Take 1 tablet by mouth daily.     nitroGLYCERIN (NITROSTAT) 0.4 MG SL tablet Place 1 tablet (0.4 mg total) under the tongue every 5 (five) minutes as needed for chest pain. 25 tablet 3   Pseudoeph-Doxylamine-DM-APAP (NYQUIL PO) Take by mouth every other day. nyquil sleep     ticagrelor (BRILINTA) 60 MG TABS tablet TAKE 1 TABLET BY MOUTH 2 TIMES DAILY. STOP THE 90 MG BRILINTA. 180 tablet 0   zolpidem (AMBIEN) 10 MG tablet Take 15 mg by mouth every other day.  5   No current facility-administered medications for this visit.     Family History    Family History  Problem Relation Age of Onset   Stroke Father    Other Mother        stomach problems    He indicated that his mother is deceased. He indicated that his father is deceased. He indicated that both of his sisters are alive. He indicated that his maternal grandmother is alive. He indicated that his maternal grandfather is alive. He indicated that his paternal grandmother is alive. He indicated that his paternal grandfather is alive. He indicated that his daughter is alive. He indicated that his son is alive.  Social History    Social History   Socioeconomic History   Marital status: Married    Spouse name: Not on file   Number of children: Not on file   Years of education:  Not on file   Highest education level: Not on file  Occupational History   Not on file  Tobacco Use   Smoking status: Never   Smokeless tobacco: Current    Types: Chew  Substance and Sexual Activity   Alcohol use: Yes    Alcohol/week: 2.0 standard drinks of alcohol    Types: 2 Cans of beer per week    Comment: history of heavy ETOH abuse, 1/2 gallon of whiskey every 2 days   Drug use: No   Sexual activity: Not on file  Other Topics Concern   Not on file  Social History Narrative   Not on file   Social Determinants of Health   Financial Resource  Strain: Not on file  Food Insecurity: Not on file  Transportation Needs: Not on file  Physical Activity: Not on file  Stress: Not on file  Social Connections: Not on file  Intimate Partner Violence: Not on file     Review of Systems    General:  No chills, fever, night sweats or weight changes.  Complains of fatigue. Cardiovascular:  No chest pain, dyspnea on exertion, edema, orthopnea, palpitations, paroxysmal nocturnal dyspnea. Dermatological: No rash, lesions/masses Respiratory: No cough, dyspnea Urologic: No hematuria, dysuria Abdominal:   No nausea, vomiting, diarrhea, bright red blood per rectum, melena, or hematemesis Neurologic:  No visual changes, wkns, changes in mental status. All other systems reviewed and are otherwise negative except as noted above.  EKG Interpretation Date/Time:  Thursday October 05 2022 09:32:21 EDT Ventricular Rate:  56 PR Interval:  198 QRS Duration:  94 QT Interval:  400 QTC Calculation: 386 R Axis:   44  Text Interpretation: Sinus bradycardia Anteroseptal infarct , age undetermined When compared with ECG of 16-Sep-2020 05:19, Anteroseptal infarct is now Present Confirmed by Joni Reining [phone]) on 10/05/2022 9:53:03 AM    Physical Exam    VS:  BP 112/80 (BP Location: Left Arm, Patient Position: Sitting, Cuff Size: Normal)   Pulse (!) 56   Ht 5\' 9"  (1.753 m)   Wt 221 lb 6.4 oz  (100.4 kg)   SpO2 97%   BMI 32.70 kg/m  , BMI Body mass index is 32.7 kg/m.     GEN: Well nourished, well developed, in no acute distress. HEENT: normal. Neck: Supple, no JVD, carotid bruits, or masses. Cardiac: RRR, bradycardic,  no murmurs, rubs, or gallops. No clubbing, cyanosis, edema.  Radials/DP/PT 2+ and equal bilaterally.  Respiratory:  Respirations regular and unlabored, clear to auscultation bilaterally. GI: Soft, nontender, nondistended, BS + x 4. MS: no deformity or atrophy. Skin: warm and dry, no rash. Neuro:  Strength and sensation are intact. Psych: Normal affect.  EKG Interpretation Date/Time:  Thursday October 05 2022 09:32:21 EDT Ventricular Rate:  56 PR Interval:  198 QRS Duration:  94 QT Interval:  400 QTC Calculation: 386 R Axis:   44  Text Interpretation: Sinus bradycardia Anteroseptal infarct , age undetermined When compared with ECG of 16-Sep-2020 05:19, Anteroseptal infarct is now Present Confirmed by Joni Reining [phone]) on 10/05/2022 9:53:03 AM   Lab Results  Component Value Date   WBC 5.6 09/16/2020   HGB 13.2 09/16/2020   HCT 39.1 09/16/2020   MCV 94.0 09/16/2020   PLT 123 (L) 09/16/2020   Lab Results  Component Value Date   CREATININE 1.04 09/16/2020   BUN 15 09/16/2020   NA 139 09/16/2020   K 3.9 09/16/2020   CL 103 09/16/2020   CO2 26 09/16/2020   Lab Results  Component Value Date   ALT 54 (H) 09/15/2020   AST 57 (H) 09/15/2020   ALKPHOS 49 09/15/2020   BILITOT 0.6 09/15/2020   No results found for: "CHOL", "HDL", "LDLCALC", "LDLDIRECT", "TRIG", "CHOLHDL"  No results found for: "HGBA1C"   Review of Prior Studies EKG Interpretation Date/Time:  Thursday October 05 2022 09:32:21 EDT Ventricular Rate:  56 PR Interval:  198 QRS Duration:  94 QT Interval:  400 QTC Calculation: 386 R Axis:   44  Text Interpretation: Sinus bradycardia Anteroseptal infarct , age undetermined When compared with ECG of 16-Sep-2020 05:19,  Anteroseptal infarct is now Present Confirmed by Joni Reining [phone]) on 10/05/2022 9:53:03 AM Cath-PCI 09/15/20: Suezanne Jacquet LAD 80%,  mid LAD 100% @ D2 with patent LIMA-dLAD -> distal LAD diffuse 60%. Prox-Mid LCx 50%, Ost OM1 100% with patent SVG-OM1.  Small RI 70% (better than initial Cath). Prox-distal RCA 100% CTO, RPAV 90%. SVG-rPDA 75% => DES PCI ONYX FRONTIER 3.5 X 18; EF 55-65%.  TTE :09/15/20: EF 55 to 60%.  No R WMA.       Assessment & Plan   1.  Coronary artery disease: History of coronary artery bypass grafting.  I think his fatigue is related to decreased nicotine and caffeine which he  has been working on over the last 2 months.  He finally stopped dipping tobacco.    I will decrease his metoprolol from 25 mg twice daily to 12.5 mg twice daily due to bradycardia and slightly low blood pressure at 112/80.  Hopefully this will help him to feel some better without the lower heart rate and lower blood pressure and give him a little more stamina.  Labs do not indicate any issues currently.  TSH was pending.  I have asked him to do purposeful exercise of walking 20 minutes a day sustained to help with energy level.  He verbalizes understanding.  May consider stopping Brilinta on follow-up.  If he does not have any improvement in his symptoms may consider doing a stress test.   2.  Hypercholesterolemia: He remains on atorvastatin 40 mg daily and Zetia 10 mg.  Total cholesterol 93, LDL 42, HDL 31.  No changes in regimen.  3.  Former tobacco abuse: He is stop dipping snuff for the last 2 months.  He was up to a can a day which helped him with his energy level.  Some of this discontinuation of nicotine may be causing him to feel a little more tired and gaining weight.  Hopefully decreasing the dose of metoprolol and increasing his exercise will help to gain more stamina and energy.          Signed, Bettey Mare. Liborio Nixon, ANP, AACC   10/05/2022 10:37 AM      Office [phone] Fax [phone]  Notice: This dictation was prepared with Dragon dictation along with smaller phrase technology. Any transcriptional errors that result from this process are unintentional and may not be corrected upon review.

## 2022-10-05 ENCOUNTER — Encounter: Payer: Self-pay | Admitting: Adult Health

## 2022-10-05 ENCOUNTER — Ambulatory Visit: Payer: BC Managed Care – PPO | Attending: Adult Health | Admitting: Adult Health

## 2022-10-05 VITALS — BP 112/80 | HR 56 | Ht 69.0 in | Wt 221.4 lb

## 2022-10-05 DIAGNOSIS — I480 Paroxysmal atrial fibrillation: Secondary | ICD-10-CM | POA: Diagnosis not present

## 2022-10-05 DIAGNOSIS — I2510 Atherosclerotic heart disease of native coronary artery without angina pectoris: Secondary | ICD-10-CM

## 2022-10-05 DIAGNOSIS — I10 Essential (primary) hypertension: Secondary | ICD-10-CM | POA: Diagnosis not present

## 2022-10-05 DIAGNOSIS — Z9861 Coronary angioplasty status: Secondary | ICD-10-CM

## 2022-10-05 DIAGNOSIS — E7800 Pure hypercholesterolemia, unspecified: Secondary | ICD-10-CM | POA: Diagnosis not present

## 2022-10-05 MED ORDER — METOPROLOL TARTRATE 25 MG PO TABS: 12.5000 mg | ORAL_TABLET | Freq: Two times a day (BID) | ORAL | 3 refills | Status: AC

## 2022-10-05 NOTE — Patient Instructions (Signed)
Medication Instructions:  Decrease Metoprolol Tartrate to 12.5 mg ( Take  Twice Daily). *If you need a refill on your cardiac medications before your next appointment, please call your pharmacy*   Lab Work: No labs If you have labs (blood work) drawn today and your tests are completely normal, you will receive your results only by: MyChart Message (if you have MyChart) OR A paper copy in the mail If you have any lab test that is abnormal or we need to change your treatment, we will call you to review the results.   Testing/Procedures: No Testing   Follow-Up: At [facility], you and your health needs are our priority.  As part of our continuing mission to provide you with exceptional heart care, we have created designated Provider Care Teams.  These Care Teams include your primary Cardiologist (physician) and Advanced Practice Providers (APPs -  Physician Assistants and Nurse Practitioners) who all work together to provide you with the care you need, when you need it.  We recommend signing up for the patient portal called "MyChart".  Sign up information is provided on this After Visit Summary.  MyChart is used to connect with patients for Virtual Visits (Telemedicine).  Patients are able to view lab/test results, encounter notes, upcoming appointments, etc.  Non-urgent messages can be sent to your provider as well.   To learn more about what you can do with MyChart, go to [email or web address].    Your next appointment:   1 month(s)  Provider:   Joni Reining, DNP, ANP     Other Instructions Take Blood Pressure Daily. Please Bring Blood Pressure Log to Follow Up Appointment.

## 2022-10-20 ENCOUNTER — Other Ambulatory Visit: Payer: Self-pay | Admitting: *Deleted

## 2022-10-20 MED ORDER — EZETIMIBE 10 MG PO TABS: 10.0000 mg | ORAL_TABLET | Freq: Every day | ORAL | 3 refills | Status: DC

## 2022-11-08 NOTE — Progress Notes (Signed)
Cardiology Office Note:  .   Date:  11/08/2022  ID:  Edgar Brooks, DOB 07-19-1955, MRN [id number] PCP: Edgar Peak, PA-C  [facility] Providers Cardiologist:  Dr. Herbie Brooks  }   History of Present Illness: .   Edgar Brooks is a 67 y.o. male with history of severe RCA, LAD, and RI disease, status post three-vessel CABG  (LIMA-LAD, SVG-OM, SVG-RPDA ACS 09/15/2020: PCI SVG-PDA with stent SVG to RPDA, September 15, 2020 hyperlipidemia, postoperative atrial fibrillation not on DOAC, and chronic dizzy spells. On last visit I decreased his metoprolol from 25 mg BID to 12.5 mg BID due to fatigue.  He comes today feeling much better.  He has been walking every day for 20 to 30 minutes.  Initially outside but when has the weather has changed she has been walking on his treadmill a minimum of 30 minutes each morning.  He states his energy level has improved with exercise.  He is starting to lose some weight.  He denies any significant chest discomfort, shortness of breath, or dizziness associated with his exercise routine.  He has been keeping up with his blood pressure and it is been excellently controlled.   ROS: As above otherwise negative.  Studies Reviewed: [name]      [facility]HC 09/15/2020   RPAV lesion is 90% stenosed.   Ost LAD lesion is 80% stenosed.   Mid LAD lesion is 100% stenosed.LIMA to LAD is patent.   Dist LAD lesion is 60% stenosed, diffusely diseased.   Prox Cx to Mid Cx lesion is 50% stenosed.   Ost 1st Mrg to 1st Mrg lesion is 100% stenosed. SVG to OM is patent.   Ramus-1 lesion is 70% stenosed.  Appearance has improved since the prior cath.   Prox RCA to Dist RCA lesion is 100% stenosed.   SVG to RPDA with  Mid Graft lesion 75% stenosed.   A drug-eluting stent was successfully placed using a STENT ONYX FRONTIER 3.5X18.   Post intervention, there is a 0% residual stenosis.   The left ventricular systolic function is normal.   LV end diastolic pressure is normal.   The left  ventricular ejection fraction is 55-65% by visual estimate.   There is no aortic valve stenosis.   Successful PCI of SVG to PDA.   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     DAPT for 12 months. Consider clopidogrel monotherapy after that time given his diffuse disease.    Physical Exam:   VS:  There were no vitals taken for this visit.   Wt Readings from Last 3 Encounters:  10/05/22 221 lb 6.4 oz (100.4 kg)  10/03/21 211 lb 9.6 oz (96 kg)  01/19/21 213 lb 6.4 oz (96.8 kg)    GEN: Well nourished, well developed in no acute distress NECK: No JVD; No carotid bruits CARDIAC: RRR, no murmurs, rubs, gallops RESPIRATORY:  Clear to auscultation without rales, wheezing or rhonchi  ABDOMEN: Soft, non-tender, non-distended EXTREMITIES:  No edema; No deformity   ASSESSMENT AND PLAN: .    Coronary artery disease: History of PCI of the SVG to PDA with prior CABG. he is doing very well without any recurrence of chest discomfort.  I will discontinue Brilinta and begin him on aspirin 81 mg daily.  He will continue secondary management with blood pressure control, lipid management, purposeful exercise as he has been doing daily now, and weight management.  2.  Hypercholesterolemia: He is on atorvastatin 40 mg daily.  I  have reviewed his most recent labs from July 2024 total cholesterol was 90, LDL was 47.  I will decrease his atorvastatin to 20 mg daily and he will need to have this rechecked on follow-up in 6 months.  He will continue on Zetia 10 mg daily.  3.  Hypertension: Very well-controlled today.  I have explained to him with his increased exercise and weight loss plans, he is to continue to take his blood pressure as directed.  If blood pressures less than 100 systolic he should call us so that we can make medication adjustments to avoid hypotension.     Signed, Edgar Brooks, ANP, AACC

## 2022-11-10 ENCOUNTER — Encounter: Payer: Self-pay | Admitting: Adult Health

## 2022-11-10 ENCOUNTER — Ambulatory Visit: Payer: Medicare PPO | Attending: Adult Health | Admitting: Adult Health

## 2022-11-10 VITALS — BP 102/64 | HR 67 | Ht 69.0 in | Wt 222.0 lb

## 2022-11-10 DIAGNOSIS — E7800 Pure hypercholesterolemia, unspecified: Secondary | ICD-10-CM | POA: Diagnosis not present

## 2022-11-10 DIAGNOSIS — I2510 Atherosclerotic heart disease of native coronary artery without angina pectoris: Secondary | ICD-10-CM | POA: Diagnosis not present

## 2022-11-10 DIAGNOSIS — I10 Essential (primary) hypertension: Secondary | ICD-10-CM

## 2022-11-10 MED ORDER — ATORVASTATIN CALCIUM 20 MG PO TABS: 20.0000 mg | ORAL_TABLET | Freq: Every day | ORAL | 3 refills | Status: AC

## 2022-11-10 MED ORDER — ASPIRIN 81 MG PO TBEC: 81.0000 mg | DELAYED_RELEASE_TABLET | Freq: Every day | ORAL | Status: AC

## 2022-11-10 NOTE — Patient Instructions (Signed)
Medication Instructions:  Decrease Lipitor to 20 mg ( Take 1 Tablet Daily). Start Aspirin 81 mg ( Take 1 Tablet Daily). Stop Brilinta *If you need a refill on your cardiac medications before your next appointment, please call your pharmacy*   Lab Work: No Labs If you have labs (blood work) drawn today and your tests are completely normal, you will receive your results only by: MyChart Message (if you have MyChart) OR A paper copy in the mail If you have any lab test that is abnormal or we need to change your treatment, we will call you to review the results.   Testing/Procedures: No Testing   Follow-Up: At [facility], you and your health needs are our priority.  As part of our continuing mission to provide you with exceptional heart care, we have created designated Provider Care Teams.  These Care Teams include your primary Cardiologist (physician) and Advanced Practice Providers (APPs -  Physician Assistants and Nurse Practitioners) who all work together to provide you with the care you need, when you need it.  We recommend signing up for the patient portal called "MyChart".  Sign up information is provided on this After Visit Summary.  MyChart is used to connect with patients for Virtual Visits (Telemedicine).  Patients are able to view lab/test results, encounter notes, upcoming appointments, etc.  Non-urgent messages can be sent to your provider as well.   To learn more about what you can do with MyChart, go to [email or web address].    Your next appointment:   6 month(s)  Provider:   Bryan Lemma, MD    Fro Systolic Blood Pressure is Below 100 , Call Office.

## 2023-03-06 LAB — EXTERNAL GENERIC LAB PROCEDURE: COLOGUARD: NEGATIVE

## 2023-03-06 LAB — COLOGUARD: COLOGUARD: NEGATIVE

## 2023-03-27 ENCOUNTER — Other Ambulatory Visit: Payer: Self-pay | Admitting: Cardiology

## 2023-04-02 IMAGING — CR DG CHEST 2V
2 series · 2 of 2 positions shown · non-contrast
Comparison: Chest radiograph dated 06/12/2017.

CLINICAL DATA: Chest pain.

EXAM:
CHEST - 2 VIEW

[chest pa]
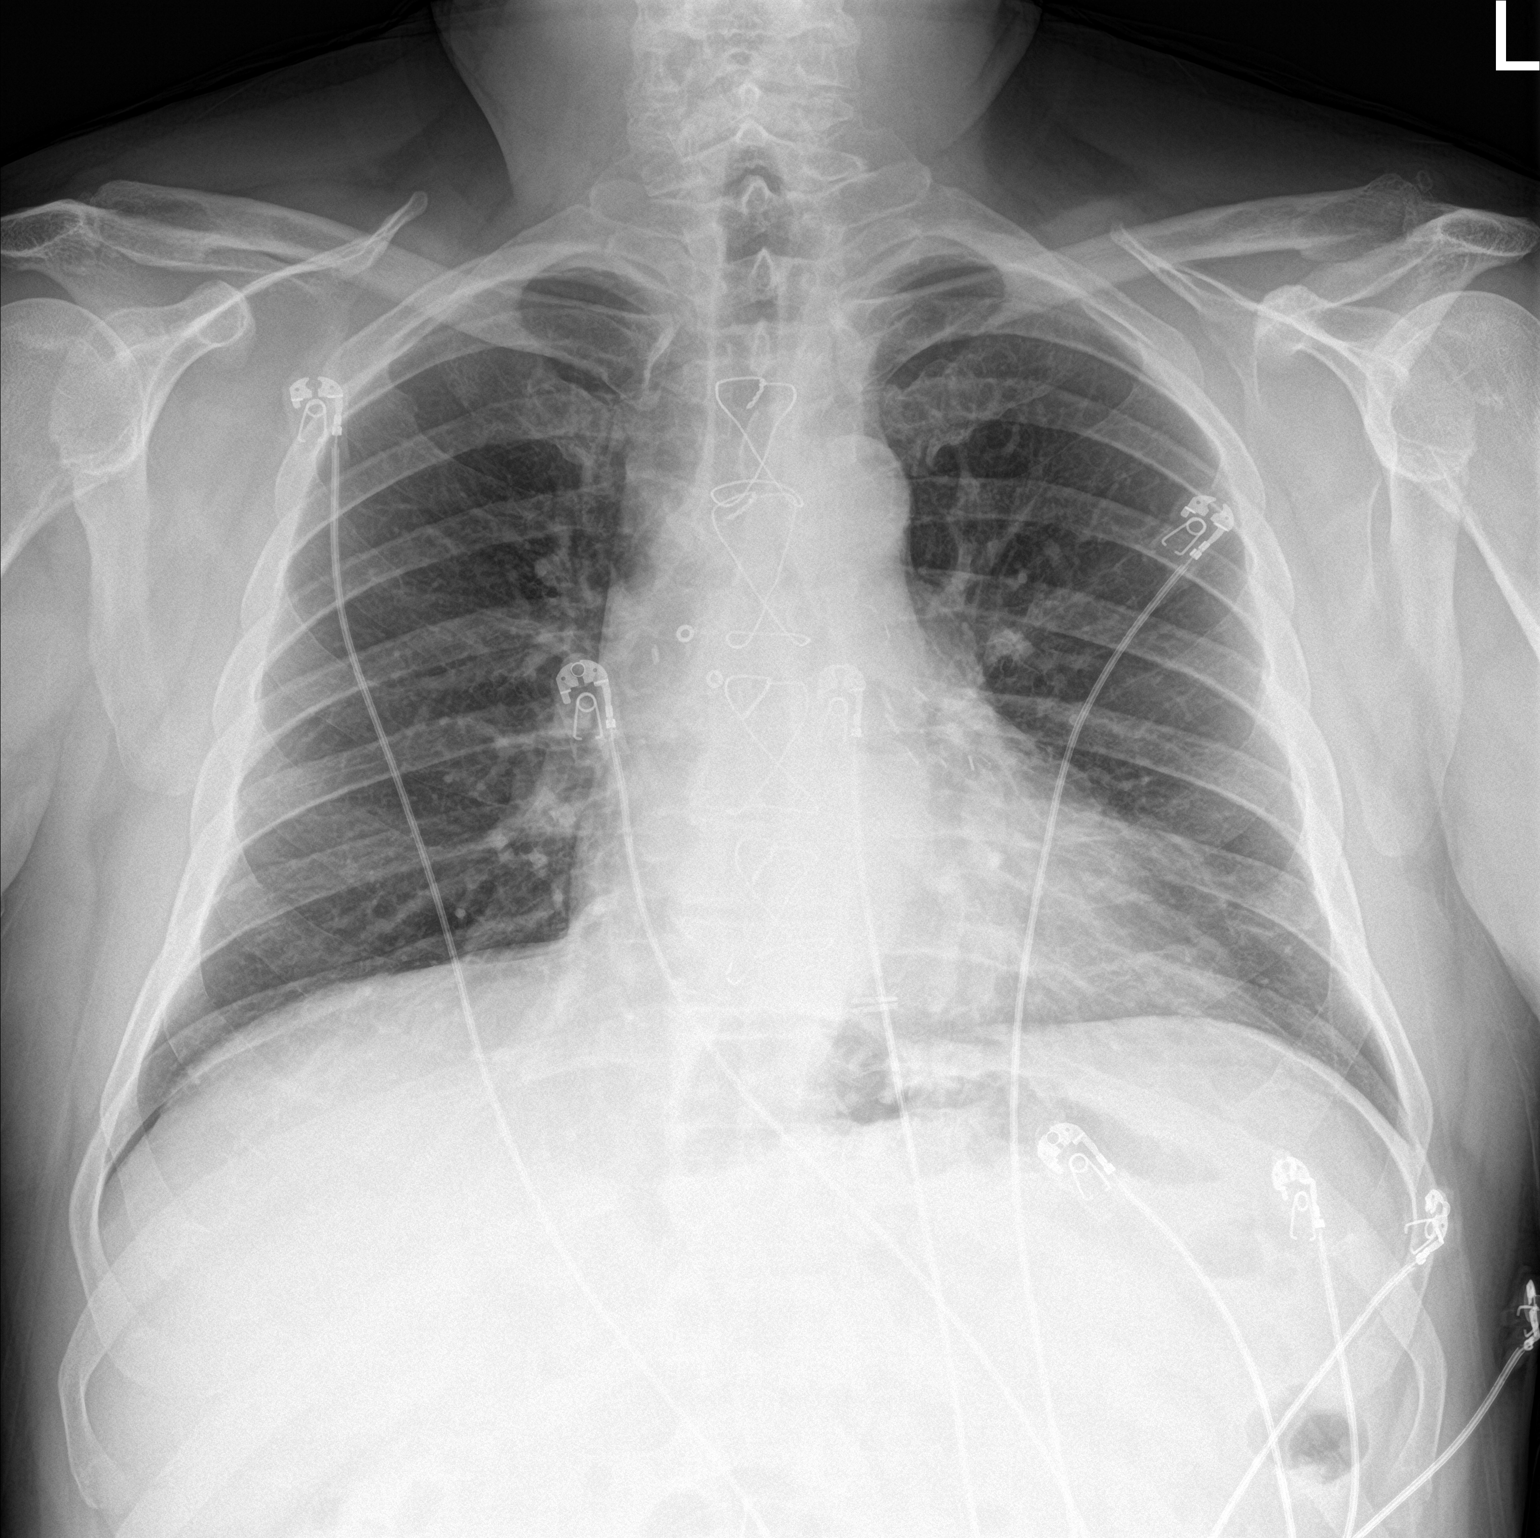

[chest lat]
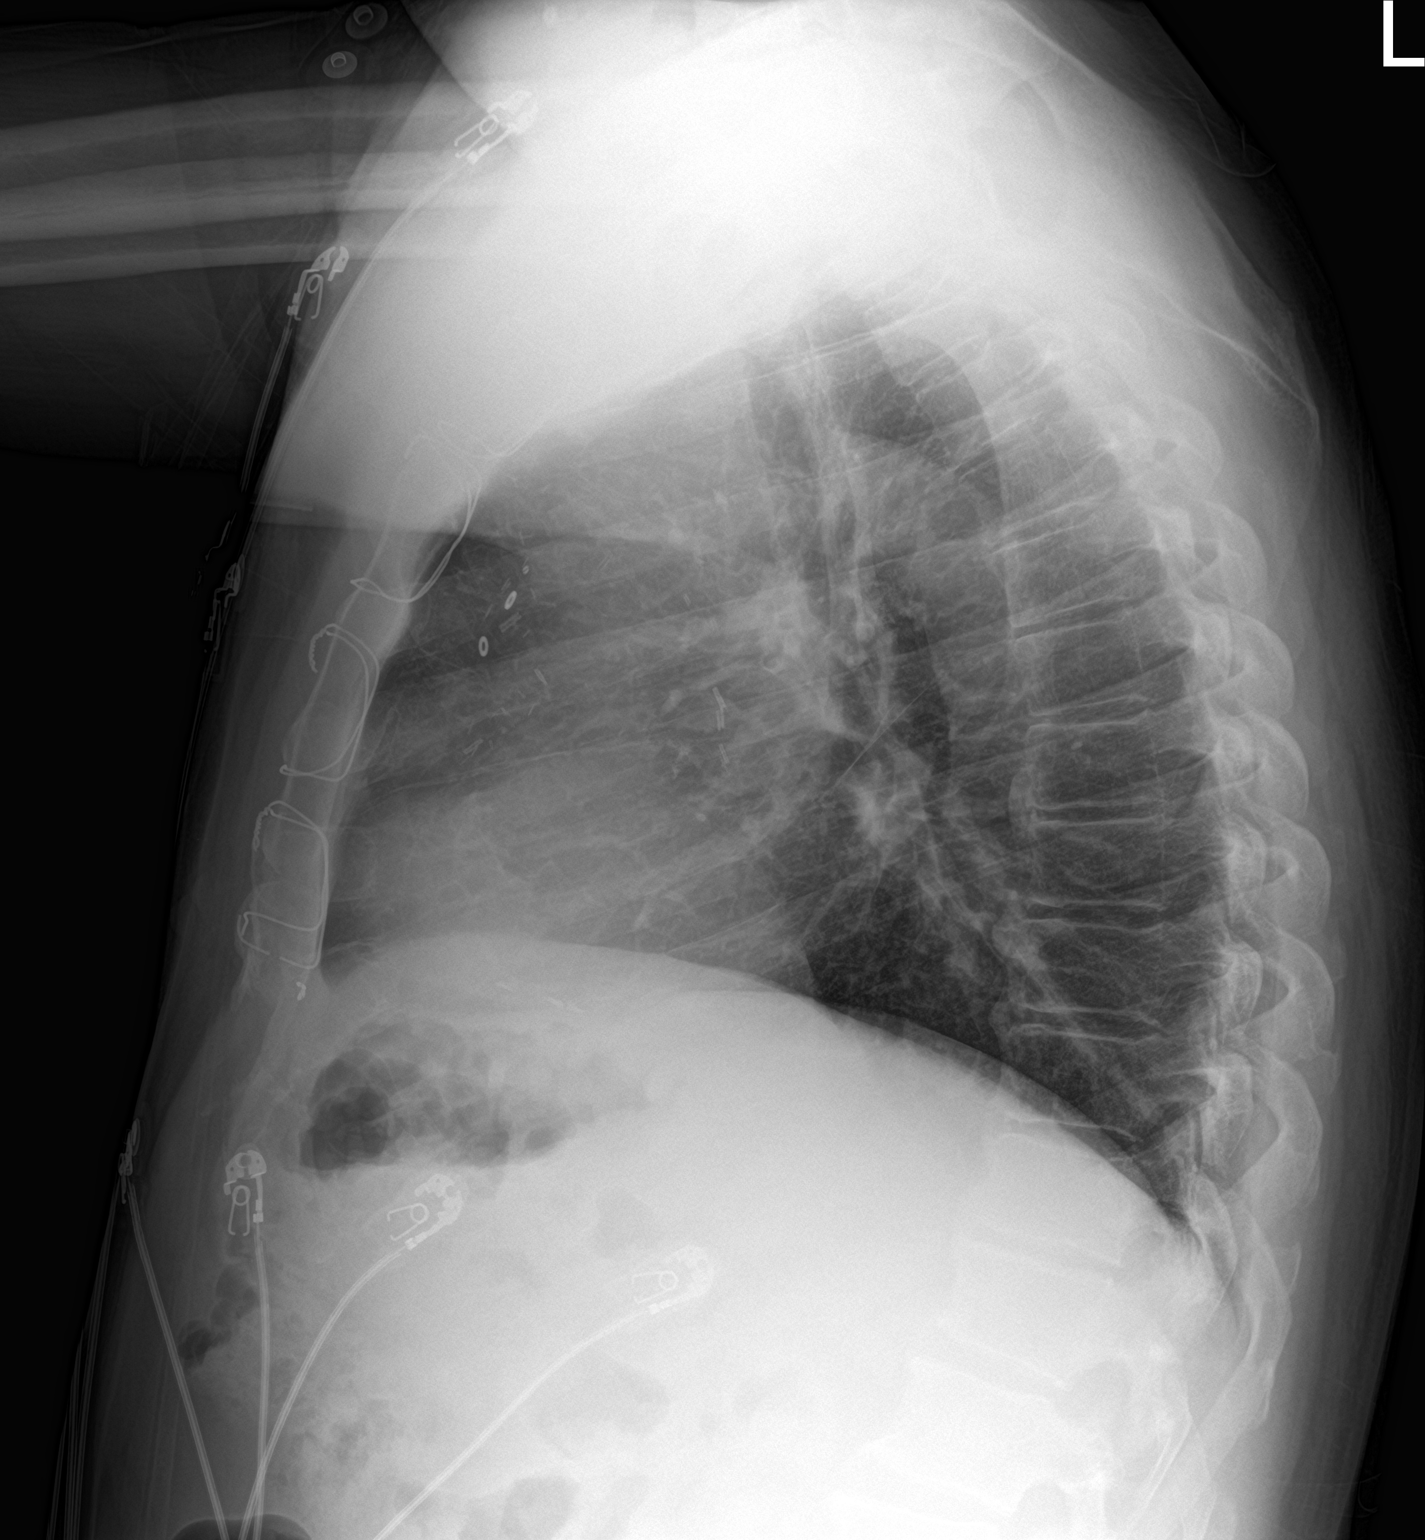

[2 of 2 positions shown; findings below may reference images not displayed]

FINDINGS: Left lung base streaky atelectasis or scarring. No focal
consolidation, pleural effusion, or pneumothorax. Stable
cardiomediastinal silhouette. Median sternotomy wires and CABG
vascular clips. No acute osseous pathology.
IMPRESSION: No active cardiopulmonary disease.

## 2023-06-01 ENCOUNTER — Other Ambulatory Visit: Payer: Self-pay | Admitting: Cardiology

## 2023-08-09 DIAGNOSIS — G4733 Obstructive sleep apnea (adult) (pediatric): Secondary | ICD-10-CM | POA: Diagnosis not present

## 2023-08-09 DIAGNOSIS — Z1331 Encounter for screening for depression: Secondary | ICD-10-CM | POA: Diagnosis not present

## 2023-08-09 DIAGNOSIS — M25561 Pain in right knee: Secondary | ICD-10-CM | POA: Diagnosis not present

## 2023-08-09 DIAGNOSIS — E7800 Pure hypercholesterolemia, unspecified: Secondary | ICD-10-CM | POA: Diagnosis not present

## 2023-08-09 DIAGNOSIS — I10 Essential (primary) hypertension: Secondary | ICD-10-CM | POA: Diagnosis not present

## 2023-08-09 DIAGNOSIS — G4700 Insomnia, unspecified: Secondary | ICD-10-CM | POA: Diagnosis not present

## 2023-08-09 DIAGNOSIS — I2510 Atherosclerotic heart disease of native coronary artery without angina pectoris: Secondary | ICD-10-CM | POA: Diagnosis not present

## 2023-08-09 DIAGNOSIS — R7303 Prediabetes: Secondary | ICD-10-CM | POA: Diagnosis not present

## 2023-08-09 DIAGNOSIS — K219 Gastro-esophageal reflux disease without esophagitis: Secondary | ICD-10-CM | POA: Diagnosis not present

## 2023-08-10 LAB — LAB REPORT - SCANNED
A1c: 5.4
EGFR (Non-African Amer.): 74

## 2023-11-26 ENCOUNTER — Ambulatory Visit: Attending: Cardiology | Admitting: Cardiology

## 2023-11-26 ENCOUNTER — Encounter: Payer: Self-pay | Admitting: Cardiology

## 2023-11-26 VITALS — BP 138/77 | HR 69 | Ht 69.0 in | Wt 215.0 lb

## 2023-11-26 DIAGNOSIS — I9789 Other postprocedural complications and disorders of the circulatory system, not elsewhere classified: Secondary | ICD-10-CM

## 2023-11-26 DIAGNOSIS — E785 Hyperlipidemia, unspecified: Secondary | ICD-10-CM | POA: Diagnosis not present

## 2023-11-26 DIAGNOSIS — I10 Essential (primary) hypertension: Secondary | ICD-10-CM

## 2023-11-26 DIAGNOSIS — Z951 Presence of aortocoronary bypass graft: Secondary | ICD-10-CM

## 2023-11-26 DIAGNOSIS — Z9861 Coronary angioplasty status: Secondary | ICD-10-CM | POA: Diagnosis not present

## 2023-11-26 DIAGNOSIS — I4891 Unspecified atrial fibrillation: Secondary | ICD-10-CM

## 2023-11-26 DIAGNOSIS — R42 Dizziness and giddiness: Secondary | ICD-10-CM

## 2023-11-26 DIAGNOSIS — I25118 Atherosclerotic heart disease of native coronary artery with other forms of angina pectoris: Secondary | ICD-10-CM

## 2023-11-26 DIAGNOSIS — Z659 Problem related to unspecified psychosocial circumstances: Secondary | ICD-10-CM

## 2023-11-26 DIAGNOSIS — I2510 Atherosclerotic heart disease of native coronary artery without angina pectoris: Secondary | ICD-10-CM

## 2023-11-26 NOTE — Progress Notes (Unsigned)
 Cardiology Office Note:  .   Date:  11/29/2023  ID:  WEBB WEED, DOB [birth year], MRN [id number] PCP: Montey Lot, DEVONNA  [facility] Providers Cardiologist:  Alm Clay, MD     Chief Complaint  Patient presents with   Follow-up    Annual follow-up.  No major issues   Coronary Artery Disease    History of CABG.  Relatively asymptomatic.  Due for surveillance stress testing prior to follow-up.  (4 years out would be fall 2026).    Patient Profile: .     NESTER BACHUS is a mildly obese 68 y.o. male with a PMH notable for CAD with CRF's of HTN and HLD (reviewed below) who presents here for 1 year follow-up evaluation of CAD at the request of Montey Lot, PA-C.  PMH MV CAD (07/2012)=> [facility] Abn Myoview  => MV CAD (pLAD ~75% calcified, Large / bifurcating RI w/ 90% medial (Diag) territory, Cx- sm OM & PLB, mRCA 80% -> ) => CABG x3 LIMA-LAD, SVG-OM1, SVG-RPDA; post-op Afib => initially Rx with Amiodaone.  No further episodes  CATH: 10/2014 ([facility]) Patent Grafts, mLAD (after sm D1) 100%, Inf Branch RI 100%, Sup RI branch (non-grafted) tandem ~80 & 65%, mRCA 100%, LCx mod Dz, dRCA - RPAV 90+%.(Med Rx); \ CATH-PCI (08/2020) - DES PCI 75% SVG-rPDA ONYX FRONTIER 3.5 X 18;  HTN/HLD Intermittent Dizzy Spells - Near Syncope NO Syncope   DURREL MCNEE was last seen on November 10, 2023 by Lamarr Satterfield, NP: Feeling well.  Walking every day about 20 to 30 minutes either on the treadmill inside or when the weather is better outside.  Energy level most notably improved.  Has started losing weight.  No symptoms of chest pain or pressure/dyspnea with rest or exertion.  BP well-controlled.  Brilinta  was discontinued in favor of restarting 81 mg aspirin .  Lipids well-controlled on combination of atorvastatin  4 mg daily and Zetia  10 g daily.  He was continued on low-dose Lopressor  12.5 mg twice daily.  Subjective  Discussed the use of AI scribe software for clinical note transcription  with the patient, who gave verbal consent to proceed.  History of Present Illness Nissan Frazzini is a 68 year old male with coronary artery disease who presents with concerns about stress and potential heart issues.  He has been experiencing significant stress over the past eleven months due to a divorce, raising concerns about its impact on his heart health. He recalls a past myocardial infarction in 2014, which he attributes to stress following the deaths of his father and wife.  He has a history of coronary artery disease, having undergone coronary artery bypass grafting in 2014 and stent placement in 2022. Currently, he is asymptomatic with no chest pain, pressure, or tightness. No heart palpitations, dizziness, or syncope are reported. He mentions a previous episode of atrial fibrillation post-surgery, which resolved after discharge.  His current medications include aspirin , atorvastatin  20 mg, Zetia  10 mg, and a low dose of metoprolol . He notes a reduction in metoprolol  dosage has improved his energy levels. Additionally, he takes a multivitamin daily and consumes a mixture of beetroot juice, pomegranate, and watermelon, which he feels has boosted his energy and alleviated cold extremities.  He denies shortness of breath and is able to walk 20-30 minutes daily without issues. He does not mention any swelling in his legs or feet. Occasional lightheadedness is attributed to dehydration and resolves with increased water  intake.  Recent lab results from August 10, 2023, show stable cholesterol levels and an A1c of 5.4. He is not diabetic and maintains a healthy lifestyle, avoiding sodas and consuming alcohol occasionally.   Cardiovascular ROS: no chest pain or dyspnea on exertion positive for - occasional positional dizziness - Near syncope.  negative for - edema, irregular heartbeat, loss of consciousness, orthopnea, paroxysmal nocturnal dyspnea, rapid heart rate, shortness of breath, or  TIA/CVA/Amaurosis Fugax, No longer notign minimal claudication drinking beet/pomegranite juice cocktail  ROS:  Review of Systems - Negative except emotional stress 2/2 ongoing divorce procedings    Objective   Past Medical History - Coronary artery disease with history of myocardial infarction and coronary artery bypass grafting (2014), status post stent placement (2022) - Atrial fibrillation (post-operative, resolved) - Hypertension - Hyperlipidemia  Surgical History: - Coronary artery bypass grafting (2014) - Stent placement in the main graft to the posterior descending artery (2022)  Family History - Father: deceased due to heart attack - Wife: deceased due to heart attack  Social History Alcohol: Consumes beer occasionally Partner Status: Divorced The patient has been experiencing stress due to a recent divorce. The patient also experienced significant stress in the past due to the deaths of his father and wife in close succession.  Medications - Metoprolol  to tartrate 12.5 mg twice daily; - Nitroglycerin  0.4 mg as needed - Aspirin  81 mg daily - Atorvastatin  20 mg; - Zetia  10 mg; Zetia  10 mg daily  -Nexium 41 daily, NyQuil as needed, Ambien  10 mg.  Sleep - Multivitamin - Beet and vegetable juice cocktail  Studies Reviewed: SABRA   EKG Interpretation Date/Time:  Monday November 26 2023 14:45:50 EST Ventricular Rate:  69 PR Interval:  182 QRS Duration:  84 QT Interval:  384 QTC Calculation: 411 R Axis:   31  Text Interpretation: Normal sinus rhythm Anterior infarct (cited on or before 05-Oct-2022) When compared with ECG of 05-Oct-2022 09:32, Questionable change in initial forces of Anteroseptal leads T wave inversion no longer evident in Lateral leads Confirmed by Anner Lenis ([id number]) on 11/26/2023 3:33:16 PM    Results LABS WBC: 6.3 (08/10/2023) Hb: 14.8 (08/10/2023) PLT: 153 (08/10/2023) Glucose: 102 (08/10/2023) BUN: 16 (08/10/2023) Cr: 1.11  (08/10/2023) Na: 140 (08/10/2023) K: 4.4 (08/10/2023) AST: 26 (08/10/2023) ALT: 31 (08/10/2023) ALP: 63 (08/10/2023) Total Cholesterol: 114 (08/10/2023) Triglycerides: 110 (08/10/2023) HDL: 37 (08/10/2023) LDL: 57 (08/10/2023) HbA1c: 5.4 (08/10/2023)  DIAGNOSTIC ECG: Normal  Past Surgical History:  Procedure Date   TRANSTHORACIC ECHOCARDIOGRAM: [facility]: EF > 55%, Dgen MV Dz w/ mild MR, Ao Sclerosis, Mild AI, Ascending Aorta ~4.0-4.2 cm @ sinotubular Jxn 07/16/2012       NM MYOVIEW  LTD: [facility]. ABNORMAL: Mod Size, Mildly Severe Basal Mid & Apical Inferior reversible defect & ++ GXT portion with limiting Angina --> CATH 07/16/2012      Left Heart Cath and Cors: [facility]: 75% mLAD (after sm D`), RI - Inf branch 80%, Cx-OM moderate caliber,mRCA 80% & RPAV 95% --> CABG 07/16/2012      CORONARY ARTERY BYPASS GRAFT x 3: ([facility])- LIMA-LAD, SVG-Inferior RI branch (called OM1), SVG-rPDA 08/22/2012      Left Heart Cath and Cors/Grafts Angiography;  Surgeon: Lenis LELON Anner, MD;  Location[facility] INVASIVE CV LAB;  Service: Cardiovascular;  mLAD (after sm D1) 100%, OM1/infRI - 100%, SupRI (non-grafted) 80 & 65%, Cx-OM diffuse mode Dz, mRCA 100% & RPAV 95%; Patent LIMA-LAD, SVG-RPDA, SVG-RI(OM) 11/03/2014       ============  ===================    LEFT HEART CATH AND CORS/GRAFTS ANGIOGRAPHY;  Surgeon: Dann Candyce RAMAN, MD;  Location: [facility] INVASIVE CV LAB;; Ost LAD 80%, mid LAD 100% @ D2 with patent LIMA-dLAD -> distal LAD diffuse 60%. Prox-Mid LCx 50%, Ost OM1 100% with patent SVG-OM1.  Small RI 70% (better than initial Cath). Prox-distal RCA 100% CTO, RPAV 90%. SVG-rPDA 75% => DES PCI; EF 55-65%. 09/15/2020   CORONARY STENT INTERVENTION;  Surgeon: Dann Candyce RAMAN, MD;  Location: [facility] INVASIVE CV LAB;  Service: Cardiovascular;; SVG-rPDA 75% => DES PCI ONYX FRONTIER 3.5 X 18;      TRANSTHORACIC ECHOCARDIOGRAM ([facility]): EF 55 to 60%.  No R WMA.  Normal diastolic function.  Normal RV.  Unable to assess  RVP/PA P.  Mild biatrial enlargement.  Mild AoV calcification with no AS, and mild AI.  Normal MV and TV.SABRA  Aortic root - Remains 42 mm. 09/15/2020       Risk Assessment/Calculations:         Physical Exam:   VS:  BP 138/77 (BP Location: Right Arm, Patient Position: Sitting, Cuff Size: Normal)   Pulse 69   Ht 5' 9 (1.753 m)   Wt 215 lb (97.5 kg)   SpO2 99%   BMI 31.75 kg/m    Wt Readings from Last 3 Encounters:  11/26/23 215 lb (97.5 kg)  11/10/22 222 lb (100.7 kg)  10/05/22 221 lb 6.4 oz (100.4 kg)      GEN: Well nourished, well developed in no acute distress; mildly obese, but otherwise healthy appearing.  Emotionally upset - going through Divorce NECK: No JVD; No carotid bruits CARDIAC: Normal S1, S2; RRR, no murmurs, rubs, gallops RESPIRATORY:  Clear to auscultation without rales, wheezing or rhonchi ; nonlabored, good air movement. ABDOMEN: Soft, non-tender, non-distended EXTREMITIES:  No edema; No deformity      ASSESSMENT AND PLAN: .    Problem List Items Addressed This Visit       Cardiology Problems   CAD S/P percutaneous coronary angioplasty (Chronic)   PCI of SVG to RPDA in 2022.  Thienopyridine.  Converted back to aspirin  81 mg monotherapy in 2024.  - Okay to hold aspirin  81 mg 5 to 7 days preop for surgeries or procedures.      Relevant Orders   Cardiac Stress Test: Informed Consent Details: Physician/Practitioner Attestation; Transcribe to consent form and obtain patient signature   MYOCARDIAL PERFUSION IMAGING   CAD, multiple vessel - Primary (Chronic)   Coronary artery bypass grafting in 2014 and stent placement in 2022.  No current chest pain or EKG changes.  Lightheadedness resolved with hydration. Improved energy levels.  - Continue aspirin  81 mg daily, atorvastatin  40 mg, Zetia  10 mg, and metoprolol  tartrate 12.5 mg twice daily. - Encouraged hydration. - Schedule surveillance Treadmill Myoview  Stress Test for September or October next year. -  Advised on stress test risks: abnormal heart rhythm, heart attack, stroke, falls. - See Shared Decision Making/Informed Consent below      Relevant Orders   EKG 12-Lead (Completed)   Cardiac Stress Test: Informed Consent Details: Physician/Practitioner Attestation; Transcribe to consent form and obtain patient signature   MYOCARDIAL PERFUSION IMAGING   Hyperlipidemia with target low density lipoprotein (LDL) cholesterol less than 55 mg/dL (Chronic)   Cholesterol levels controlled with atorvastatin  and Zetia . LDL slightly above target but quite except low at 57.. - Continue atorvastatin  20 mg and Zetia  10 mg daily. - Labs are followed by PCP.      Postoperative atrial fibrillation (HCC) (Chronic)   No breakthrough spells since CABG.  Therefore not on DOAC.      Relevant Orders   EKG 12-Lead (Completed)   Primary hypertension (Chronic)   Blood pressure borderline but well-managed. No hypertension-related symptoms. - Continue current antihypertensive regimen.:  (Prescription appropriate 5 mg twice daily.  This could be increased but was better and having less fatigue with lower dose. Less his blood pressures get a lot higher, I would continue current regimen.        Other   Dizzy spells (Chronic)   Doing much better with hydration.      Other social stressor   Psychological stress related to recent divorce Significant stress due to divorce.  This is likely driving his BP to be higher if anything may make him feel more fatigued and less likely to exercise.  Encouraged him to continue working on exercise.      Presence of stent of CABG (Chronic)   Over 3 years out from PCI.  No recurrent symptoms.  Remains on aspirin  having completed duration of standard treatment with Thienopyridine.      Relevant Orders   Cardiac Stress Test: Informed Consent Details: Physician/Practitioner Attestation; Transcribe to consent form and obtain patient signature   MYOCARDIAL PERFUSION IMAGING         Informed Consent   Shared Decision Making/Informed Consent The risks [chest pain, shortness of breath, cardiac arrhythmias, dizziness, blood pressure fluctuations, myocardial infarction, stroke/transient ischemic attack, nausea, vomiting, allergic reaction, radiation exposure, metallic taste sensation and life-threatening complications (estimated to be 1 in 10,000)], benefits (risk stratification, diagnosing coronary artery disease, treatment guidance) and alternatives of a nuclear stress test were discussed in detail with Mr. Surgeon and he agrees to proceed.      Follow-Up: Return in about 1 year (around 11/25/2024) for 1 Yr Follow-up, To discuss test results, Routine follow up with me.  I spent 56  minutes in the care of CAMRON MONDAY today including reviewing outside labs from [facility]/scanned labs (2 minute), reviewing studies (reviewed his previous caths and stress tests as well as most recent PCI films-9 minutes), reviewing outside studies (Reports from [facility] as well as CABG report reviewed-3 minutes), face to face time discussing treatment options (27 minutes), reviewing records from recent PCP notes as well as APP note (6 minutes), 9 minutes dictating, and documenting in the encounter.      Signed, Alm MICAEL Clay, MD, MS Alm Clay, M.D., M.S. Interventional Cardiologist  [facility] Pager # [phone]

## 2023-11-26 NOTE — Patient Instructions (Addendum)
 Medication Instructions:  No changes  *If you need a refill on your cardiac medications before your next appointment, please call your pharmacy*   Lab Work: Not needed If you have labs (blood work) drawn today and your tests are completely normal, you will receive your results only by: MyChart Message (if you have MyChart) OR A paper copy in the mail If you have any lab test that is abnormal or we need to change your treatment, we will call you to review the results.   Testing/Procedures: Schedule for Sept or Oct 2026   Your doctor has scheduled you for a Myocardial Perfusion scan to obtain information about the blood flow to your heart. The test consists of taking pictures of your heart in two phases: while resting and after a stress test.  The stress test may involve walking on a treadmill, or if you are unable to exercise adequately, you will be given a drug intended to have a similar effect on the heart to that of exercise.  The test will take approximately 3 to 4  hours to complete.   How to prepare for your test: Do not eat or drink 2 hours prior to your test Do not consume products containing caffeine 12 hours prior to your test (examples: coffee (regular OR decaf), chocolate, sodas, tea) Your doctor may need you to hold certain medications prior to the test.  If so, these are listed below and should not be taken for 24 hours prior to the test.  If not listed below, you may take your medications as normal.  You may resume taking held medications on your normal schedule once the test is complete.   Meds to hold: NONE Do bring a list of your current medications with you.  If you have held any meds in preparation for the test, please bring them, as you may be required to take them once the test is completed. Do wear comfortable clothes and walking shoes.  Do not wear dresses or overalls. Do NOT wear cologne, perfume, aftershave, or fragranced lotions the day of your test (deodorants  okay). If these instructions are not followed your test will have to be rescheduled.   A nuclear cardiologist will review your test, prepare a report and send it to your physician.   If you have questions or concerns about your appointment, you can call the Nuclear Cardiology department at [phone]. If you cannot keep your appointment, please provide 48 hours notification to avoid a possible $50.00 charge to your account.   Please arrive 15 minutes prior to your appointment time for registration and insurance purposes    Follow-Up: At [facility], you and your health needs are our priority.  As part of our continuing mission to provide you with exceptional heart care, we have created designated Provider Care Teams.  These Care Teams include your primary Cardiologist (physician) and Advanced Practice Providers (APPs -  Physician Assistants and Nurse Practitioners) who all work together to provide you with the care you need, when you need it.     Your next appointment:   12 month(s)  The format for your next appointment:   In Person  Provider:   Alm Clay, MD

## 2023-11-29 ENCOUNTER — Encounter: Payer: Self-pay | Admitting: Cardiology

## 2023-11-29 DIAGNOSIS — I10 Essential (primary) hypertension: Secondary | ICD-10-CM | POA: Insufficient documentation

## 2023-11-29 DIAGNOSIS — Z659 Problem related to unspecified psychosocial circumstances: Secondary | ICD-10-CM | POA: Insufficient documentation

## 2023-11-29 NOTE — Assessment & Plan Note (Addendum)
 Cholesterol levels controlled with atorvastatin  and Zetia . LDL slightly above target but quite except low at 57.. - Continue atorvastatin  20 mg and Zetia  10 mg daily. - Labs are followed by PCP.

## 2023-11-29 NOTE — Assessment & Plan Note (Signed)
 Over 3 years out from PCI.  No recurrent symptoms.  Remains on aspirin  having completed duration of standard treatment with Thienopyridine.

## 2023-11-29 NOTE — Assessment & Plan Note (Signed)
 Coronary artery bypass grafting in 2014 and stent placement in 2022.  No current chest pain or EKG changes.  Lightheadedness resolved with hydration. Improved energy levels.  - Continue aspirin  81 mg daily, atorvastatin  40 mg, Zetia  10 mg, and metoprolol  tartrate 12.5 mg twice daily. - Encouraged hydration. - Schedule surveillance Treadmill Myoview  Stress Test for September or October next year. - Advised on stress test risks: abnormal heart rhythm, heart attack, stroke, falls. - See Shared Decision Making/Informed Consent below

## 2023-11-29 NOTE — Assessment & Plan Note (Signed)
 No breakthrough spells since CABG.  Therefore not on DOAC.

## 2023-11-29 NOTE — Assessment & Plan Note (Signed)
 PCI of SVG to RPDA in 2022.  Thienopyridine.  Converted back to aspirin  81 mg monotherapy in 2024.  - Okay to hold aspirin  81 mg 5 to 7 days preop for surgeries or procedures.

## 2023-11-29 NOTE — Assessment & Plan Note (Signed)
 Doing much better with hydration.

## 2023-11-29 NOTE — Assessment & Plan Note (Signed)
 Psychological stress related to recent divorce Significant stress due to divorce.  This is likely driving his BP to be higher if anything may make him feel more fatigued and less likely to exercise.  Encouraged him to continue working on exercise.

## 2023-11-29 NOTE — Assessment & Plan Note (Signed)
 Blood pressure borderline but well-managed. No hypertension-related symptoms. - Continue current antihypertensive regimen.:  (Prescription appropriate 5 mg twice daily.  This could be increased but was better and having less fatigue with lower dose. Less his blood pressures get a lot higher, I would continue current regimen.

## 2024-10-27 ENCOUNTER — Encounter (HOSPITAL_COMMUNITY)
